# Patient Record
Sex: Female | Born: 1965 | Race: Black or African American | Hispanic: No | Marital: Married | Smoking: Current every day smoker
Health system: Southern US, Community
[De-identification: ages and names within clinical notes are randomized; demographics above are authoritative.]

## PROBLEM LIST (undated history)

## (undated) DIAGNOSIS — I10 Essential (primary) hypertension: Secondary | ICD-10-CM

## (undated) DIAGNOSIS — E119 Type 2 diabetes mellitus without complications: Secondary | ICD-10-CM

## (undated) DIAGNOSIS — Z87898 Personal history of other specified conditions: Secondary | ICD-10-CM

## (undated) DIAGNOSIS — J45909 Unspecified asthma, uncomplicated: Secondary | ICD-10-CM

## (undated) DIAGNOSIS — E785 Hyperlipidemia, unspecified: Secondary | ICD-10-CM

## (undated) DIAGNOSIS — Z8619 Personal history of other infectious and parasitic diseases: Secondary | ICD-10-CM

## (undated) HISTORY — DX: Personal history of other specified conditions: Z87.898

## (undated) HISTORY — PX: TUBAL LIGATION: SHX77

## (undated) HISTORY — DX: Hyperlipidemia, unspecified: E78.5

## (undated) HISTORY — DX: Essential (primary) hypertension: I10

## (undated) HISTORY — DX: Personal history of other infectious and parasitic diseases: Z86.19

## (undated) HISTORY — PX: CHOLECYSTECTOMY: SHX55

## (undated) HISTORY — DX: Type 2 diabetes mellitus without complications: E11.9

---

## 2010-07-23 ENCOUNTER — Emergency Department (HOSPITAL_COMMUNITY)
Admission: EM | Admit: 2010-07-23 | Discharge: 2010-07-23 | Payer: Self-pay | Source: Home / Self Care | Admitting: Emergency Medicine

## 2010-09-19 ENCOUNTER — Emergency Department (HOSPITAL_COMMUNITY)
Admission: EM | Admit: 2010-09-19 | Discharge: 2010-09-19 | Payer: Self-pay | Source: Home / Self Care | Admitting: Emergency Medicine

## 2010-09-27 LAB — RAPID STREP SCREEN (MED CTR MEBANE ONLY): Streptococcus, Group A Screen (Direct): NEGATIVE

## 2010-11-23 LAB — POCT PREGNANCY, URINE: Preg Test, Ur: NEGATIVE

## 2011-08-31 ENCOUNTER — Ambulatory Visit: Payer: Self-pay | Admitting: Internal Medicine

## 2015-10-23 ENCOUNTER — Other Ambulatory Visit: Payer: Self-pay | Admitting: Family Medicine

## 2015-10-27 LAB — CYTOLOGY - PAP

## 2017-02-10 ENCOUNTER — Encounter: Payer: Self-pay | Admitting: Physician Assistant

## 2017-02-10 ENCOUNTER — Ambulatory Visit: Payer: Self-pay | Admitting: Physician Assistant

## 2017-02-10 VITALS — BP 122/84 | HR 68 | Temp 98.6°F

## 2017-02-10 DIAGNOSIS — R05 Cough: Secondary | ICD-10-CM

## 2017-02-10 DIAGNOSIS — R059 Cough, unspecified: Secondary | ICD-10-CM

## 2017-02-10 MED ORDER — ALBUTEROL SULFATE HFA 108 (90 BASE) MCG/ACT IN AERS: 2.0000 | INHALATION_SPRAY | Freq: Four times a day (QID) | RESPIRATORY_TRACT | 0 refills | Status: DC | PRN

## 2017-02-10 NOTE — Progress Notes (Signed)
S: C/o cough congestion for 2 days, no fever, chills, cp/sob, v/d; mucus was yellow this am , states her chest felt a little tight, recently moved back to [place] and use to have asthma when she lived here before, doesn't have an inhaler now  Using otc meds: robitussin  O: PE: vitals wnl, nad, perrl eomi, normocephalic, tms dull, nasal mucosa red and swollen, throat injected, neck supple no lymph, lungs c t a, cv rrr, neuro intact  A:  Cough, ?asthma   P: drink fluids, continue regular meds , use otc meds of choice, return if not improving in 5 days, return earlier if worsening , albuterol inhaler, if not better Mon or if worsening will call in an antibiotic

## 2017-10-30 ENCOUNTER — Encounter (HOSPITAL_COMMUNITY): Payer: Self-pay | Admitting: Emergency Medicine

## 2017-10-30 ENCOUNTER — Ambulatory Visit (HOSPITAL_COMMUNITY)
Admission: EM | Admit: 2017-10-30 | Discharge: 2017-10-30 | Disposition: A | Discharge status: Home / Self Care | Payer: 59 | Attending: Family Medicine | Admitting: Family Medicine

## 2017-10-30 DIAGNOSIS — J069 Acute upper respiratory infection, unspecified: Secondary | ICD-10-CM | POA: Diagnosis not present

## 2017-10-30 DIAGNOSIS — B9789 Other viral agents as the cause of diseases classified elsewhere: Secondary | ICD-10-CM | POA: Diagnosis not present

## 2017-10-30 HISTORY — DX: Unspecified asthma, uncomplicated: J45.909

## 2017-10-30 MED ORDER — HYDROCODONE-HOMATROPINE 5-1.5 MG/5ML PO SYRP
5.0000 mL | ORAL_SOLUTION | Freq: Four times a day (QID) | ORAL | 0 refills | Status: DC | PRN
Start: 2017-10-30 — End: 2017-11-29

## 2017-10-30 NOTE — ED Triage Notes (Signed)
PT reports cough, chills, and dry scratchy throat.

## 2017-10-30 NOTE — ED Provider Notes (Signed)
  [facility]   [id number] 10/30/17 Arrival Time: 1704   SUBJECTIVE:  Laura Santana is a 52 y.o. female who presents to the urgent care with complaint of cough, chills, and dry scratchy throat.   Symptoms started overnight.  No fever  She works in the lab at [employer].  Has care of grandchild. Past Medical History:  Diagnosis Date  . Asthma    No family history on file. Social History   Socioeconomic History  . Marital status: Married    Spouse name: Not on file  . Number of children: Not on file  . Years of education: Not on file  . Highest education level: Not on file  Social Needs  . Financial resource strain: Not on file  . Food insecurity - worry: Not on file  . Food insecurity - inability: Not on file  . Transportation needs - medical: Not on file  . Transportation needs - non-medical: Not on file  Occupational History  . Not on file  Tobacco Use  . Smoking status: Current Every Day Smoker  . Smokeless tobacco: Never Used  Substance and Sexual Activity  . Alcohol use: Not on file  . Drug use: Not on file  . Sexual activity: Not on file  Other Topics Concern  . Not on file  Social History Narrative  . Not on file   No outpatient medications have been marked as taking for the 10/30/17 encounter [facility](Hospital Encounter).   Allergies  Allergen Reactions  . Penicillins       ROS: As per HPI, remainder of ROS negative.   OBJECTIVE:   Vitals:   10/30/17 1810 10/30/17 1811  BP:  (!) 151/85  Pulse:  94  Resp:  16  Temp:  98.8 F (37.1 C)  TempSrc:  Oral  SpO2:  98%  Weight: 173 lb (78.5 kg)   Height: 5\' 2"  (1.575 m)      General appearance: alert; no distress Eyes: PERRL; EOMI; conjunctiva normal HENT: normocephalic; atraumatic; TMs normal, canal normal, external ears normal without trauma; nasal mucosa normal; oral mucosa normal Neck: supple Lungs: clear to auscultation bilaterally Heart: regular rate and rhythm Back: no CVA  tenderness Extremities: no cyanosis or edema; symmetrical with no gross deformities Skin: warm and dry Neurologic: normal gait; grossly normal Psychological: alert and cooperative; normal mood and affect      Labs:  Results for orders placed or performed in visit on 10/23/15  Cytology - PAP  Result Value Ref Range   CYTOLOGY - PAP PAP RESULT     Labs Reviewed - No data to display  No results found.     ASSESSMENT & PLAN:  1. Viral URI with cough     Meds ordered this encounter  Medications  . HYDROcodone-homatropine (HYDROMET) 5-1.5 MG/5ML syrup    Sig: Take 5 mLs by mouth every 6 (six) hours as needed for cough.    Dispense:  60 mL    Refill:  0    Reviewed expectations re: course of current medical issues. Questions answered. Outlined signs and symptoms indicating need for more acute intervention. Patient verbalized understanding. After Visit Summary given.    Procedures:      Elvina SidleLauenstein, Demetrus Pavao, MD 10/30/17 [id number]

## 2017-11-02 DIAGNOSIS — J45901 Unspecified asthma with (acute) exacerbation: Secondary | ICD-10-CM | POA: Diagnosis not present

## 2017-11-02 DIAGNOSIS — R05 Cough: Secondary | ICD-10-CM | POA: Diagnosis present

## 2017-11-02 DIAGNOSIS — F17200 Nicotine dependence, unspecified, uncomplicated: Secondary | ICD-10-CM | POA: Insufficient documentation

## 2017-11-02 DIAGNOSIS — J069 Acute upper respiratory infection, unspecified: Secondary | ICD-10-CM | POA: Insufficient documentation

## 2017-11-02 DIAGNOSIS — J209 Acute bronchitis, unspecified: Secondary | ICD-10-CM | POA: Diagnosis not present

## 2017-11-03 ENCOUNTER — Emergency Department (HOSPITAL_COMMUNITY)
Admission: EM | Admit: 2017-11-03 | Discharge: 2017-11-03 | Disposition: A | Discharge status: Home / Self Care | Payer: 59 | Attending: Emergency Medicine | Admitting: Emergency Medicine

## 2017-11-03 ENCOUNTER — Emergency Department (HOSPITAL_COMMUNITY): Payer: 59

## 2017-11-03 ENCOUNTER — Encounter: Payer: Self-pay | Admitting: Emergency Medicine

## 2017-11-03 DIAGNOSIS — J209 Acute bronchitis, unspecified: Secondary | ICD-10-CM

## 2017-11-03 DIAGNOSIS — F17200 Nicotine dependence, unspecified, uncomplicated: Secondary | ICD-10-CM | POA: Diagnosis not present

## 2017-11-03 DIAGNOSIS — R05 Cough: Secondary | ICD-10-CM | POA: Diagnosis not present

## 2017-11-03 DIAGNOSIS — J069 Acute upper respiratory infection, unspecified: Secondary | ICD-10-CM

## 2017-11-03 DIAGNOSIS — J45901 Unspecified asthma with (acute) exacerbation: Secondary | ICD-10-CM | POA: Diagnosis not present

## 2017-11-03 MED ORDER — METHYLPREDNISOLONE 4 MG PO TBPK: ORAL_TABLET | ORAL | 0 refills | Status: DC

## 2017-11-03 MED ORDER — ALBUTEROL SULFATE (2.5 MG/3ML) 0.083% IN NEBU
5.0000 mg | INHALATION_SOLUTION | Freq: Once | RESPIRATORY_TRACT | Status: AC
  Administered 2017-11-03: 5 mg via RESPIRATORY_TRACT
  Filled 2017-11-03: qty 6

## 2017-11-03 MED ORDER — ALBUTEROL SULFATE HFA 108 (90 BASE) MCG/ACT IN AERS: 2.0000 | INHALATION_SPRAY | RESPIRATORY_TRACT | 0 refills | Status: DC | PRN

## 2017-11-03 NOTE — Discharge Instructions (Signed)
Get help right away if: °You cough up blood. °You have chest pain. °You have severe shortness of breath. °You become dehydrated. °You faint or keep feeling like you are going to faint. °You keep vomiting. °You have a severe headache. °Your fever or chills gets worse. °

## 2017-11-03 NOTE — ED Provider Notes (Signed)
[facility]EMERGENCY [facility]PT Provider Note   CSN: [id number] Arrival date & time: 11/02/17  2327     History   Chief Complaint Chief Complaint  Patient presents with  . URI    HPI Tayte Selena LesserY Kesinger is a 52 y.o. female who presents to the emergency department with chief complaint of cough.  She was seen by her primary care physician and given codeine cough syrup.  Patient states she has a history of asthma and feels like this is similar.  She has paroxysms of severe coughing and has difficulty breathing at times with tightness and wheezing.  She denies fever or chills. HPI  Past Medical History:  Diagnosis Date  . Asthma     There are no active problems to display for this patient.   Past Surgical History:  Procedure Laterality Date  . CHOLECYSTECTOMY    . TUBAL LIGATION      OB History    No data available       Home Medications    Prior to Admission medications   Medication Sig Start Date End Date Taking? Authorizing Provider  HYDROcodone-homatropine (HYDROMET) 5-1.5 MG/5ML syrup Take 5 mLs by mouth every 6 (six) hours as needed for cough. 10/30/17  Yes Elvina SidleLauenstein, Kurt, MD  ibuprofen (ADVIL,MOTRIN) 200 MG tablet Take 400 mg by mouth every 6 (six) hours as needed for moderate pain.   Yes [provider]  albuterol (PROVENTIL HFA;VENTOLIN HFA) 108 (90 Base) MCG/ACT inhaler Inhale 2 puffs into the lungs every 6 (six) hours as needed for wheezing or shortness of breath. Patient not taking: Reported on 11/03/2017 02/10/17   Faythe GheeFisher, Susan W, PA-C    Family History History reviewed. No pertinent family history.  Social History Social History   Tobacco Use  . Smoking status: Current Every Day Smoker  . Smokeless tobacco: Never Used  Substance Use Topics  . Alcohol use: No    Frequency: Never  . Drug use: No     Allergies   Penicillins   Review of Systems Review of Systems Ten systems reviewed and are negative for acute change,  except as noted in the HPI.    Physical Exam Updated Vital Signs BP (!) 154/86   Pulse 99   Temp 98.3 F (36.8 C) (Oral)   Resp 20   LMP 10/13/2017   SpO2 100%   Physical Exam  Constitutional: She is oriented to person, place, and time. She appears well-developed and well-nourished. No distress.  HENT:  Head: Normocephalic and atraumatic.  Eyes: Conjunctivae are normal. No scleral icterus.  Neck: Normal range of motion.  Cardiovascular: Normal rate, regular rhythm and normal heart sounds. Exam reveals no gallop and no friction rub.  No murmur heard. Pulmonary/Chest: Effort normal and breath sounds normal. No respiratory distress. She has no wheezes.  Abdominal: Soft. Bowel sounds are normal. She exhibits no distension and no mass. There is no tenderness. There is no guarding.  Neurological: She is alert and oriented to person, place, and time.  Skin: Skin is warm and dry. She is not diaphoretic.  Psychiatric: Her behavior is normal.  Nursing note and vitals reviewed.    ED Treatments / Results  Labs (all labs ordered are listed, but only abnormal results are displayed) Labs Reviewed - No data to display  EKG  EKG Interpretation None       Radiology Dg Chest 2 View  Result Date: 11/03/2017 CLINICAL DATA:  52 year old female with cough. EXAM: CHEST  2 VIEW  COMPARISON:  Chest radiograph dated 07/23/2010 FINDINGS: The heart size and mediastinal contours are within normal limits. Both lungs are clear. The visualized skeletal structures are unremarkable. IMPRESSION: No active cardiopulmonary disease. Electronically Signed   By: Elgie Collard M.D.   On: 11/03/2017 01:59    Procedures Procedures (including critical care time)  Medications Ordered in ED Medications  albuterol (PROVENTIL) (2.5 MG/3ML) 0.083% nebulizer solution 5 mg (5 mg Nebulization Given 11/03/17 0113)     Initial Impression / Assessment and Plan / ED Course  I have reviewed the triage vital signs  and the nursing notes.  Pertinent labs & imaging results that were available during my care of the patient were reviewed by me and considered in my medical decision making (see chart for details).     Patient with bronchitis and bronchospasm symptoms.  Patient will be given inhaler, work note and Medrol Dosepak.  I have advised her to follow-up with her primary care physician.  She appears appropriate for discharge at this time.  Final Clinical Impressions(s) / ED Diagnoses   Final diagnoses:  Upper respiratory tract infection, unspecified type  Bronchospasm with bronchitis, acute    ED Discharge Orders    None       Arthor Captain, PA-C 11/03/17 0426    Paula Libra, MD 11/03/17 [phone]

## 2017-11-03 NOTE — ED Triage Notes (Signed)
Pt complains of an URI for two days She has a hx of asthma but hasn't had an attack in years Pt was seen at Urgent Care and received cough medicine Pt states that she's had a productive cough and she's short of breath

## 2017-11-03 NOTE — ED Notes (Signed)
Bed: [facility] Expected date:  Expected time:  Means of arrival:  Comments: 

## 2017-11-29 ENCOUNTER — Other Ambulatory Visit: Payer: Self-pay

## 2017-11-29 ENCOUNTER — Ambulatory Visit (INDEPENDENT_AMBULATORY_CARE_PROVIDER_SITE_OTHER): Payer: 59 | Admitting: Physician Assistant

## 2017-11-29 ENCOUNTER — Encounter: Payer: Self-pay | Admitting: Physician Assistant

## 2017-11-29 VITALS — BP 120/88 | HR 93 | Temp 98.6°F | Resp 16 | Ht 62.0 in | Wt 174.0 lb

## 2017-11-29 DIAGNOSIS — R030 Elevated blood-pressure reading, without diagnosis of hypertension: Secondary | ICD-10-CM

## 2017-11-29 DIAGNOSIS — Z0000 Encounter for general adult medical examination without abnormal findings: Secondary | ICD-10-CM | POA: Diagnosis not present

## 2017-11-29 DIAGNOSIS — R7303 Prediabetes: Secondary | ICD-10-CM

## 2017-11-29 DIAGNOSIS — E119 Type 2 diabetes mellitus without complications: Secondary | ICD-10-CM | POA: Insufficient documentation

## 2017-11-29 DIAGNOSIS — Z0001 Encounter for general adult medical examination with abnormal findings: Secondary | ICD-10-CM | POA: Diagnosis not present

## 2017-11-29 DIAGNOSIS — B353 Tinea pedis: Secondary | ICD-10-CM | POA: Diagnosis not present

## 2017-11-29 DIAGNOSIS — G44229 Chronic tension-type headache, not intractable: Secondary | ICD-10-CM | POA: Diagnosis not present

## 2017-11-29 HISTORY — DX: Elevated blood-pressure reading, without diagnosis of hypertension: R03.0

## 2017-11-29 HISTORY — DX: Tinea pedis: B35.3

## 2017-11-29 HISTORY — DX: Chronic tension-type headache, not intractable: G44.229

## 2017-11-29 LAB — VITAMIN D 25 HYDROXY (VIT D DEFICIENCY, FRACTURES): VITD: 7.45 ng/mL — ABNORMAL LOW (ref 30.00–100.00)

## 2017-11-29 LAB — CBC WITH DIFFERENTIAL/PLATELET
Basophils Absolute: 0.1 10*3/uL (ref 0.0–0.1)
Basophils Relative: 0.8 % (ref 0.0–3.0)
Eosinophils Absolute: 0.5 10*3/uL (ref 0.0–0.7)
Eosinophils Relative: 5.4 % — ABNORMAL HIGH (ref 0.0–5.0)
HCT: 40.9 % (ref 36.0–46.0)
Hemoglobin: 13.4 g/dL (ref 12.0–15.0)
Lymphocytes Relative: 25.6 % (ref 12.0–46.0)
Lymphs Abs: 2.2 10*3/uL (ref 0.7–4.0)
MCHC: 32.9 g/dL (ref 30.0–36.0)
MCV: 90.6 fl (ref 78.0–100.0)
Monocytes Absolute: 0.8 10*3/uL (ref 0.1–1.0)
Monocytes Relative: 10.1 % (ref 3.0–12.0)
Neutro Abs: 4.9 10*3/uL (ref 1.4–7.7)
Neutrophils Relative %: 58.1 % (ref 43.0–77.0)
Platelets: 300 10*3/uL (ref 150.0–400.0)
RBC: 4.51 Mil/uL (ref 3.87–5.11)
RDW: 14.7 % (ref 11.5–15.5)
WBC: 8.4 10*3/uL (ref 4.0–10.5)

## 2017-11-29 LAB — URINALYSIS, ROUTINE W REFLEX MICROSCOPIC
Bilirubin Urine: NEGATIVE
Hgb urine dipstick: NEGATIVE
Ketones, ur: NEGATIVE
Leukocytes, UA: NEGATIVE
Nitrite: NEGATIVE
Specific Gravity, Urine: 1.025 (ref 1.000–1.030)
Total Protein, Urine: NEGATIVE
Urine Glucose: NEGATIVE
Urobilinogen, UA: 0.2 (ref 0.0–1.0)
pH: 6.5 (ref 5.0–8.0)

## 2017-11-29 LAB — HEMOGLOBIN A1C: Hgb A1c MFr Bld: 6.5 % (ref 4.6–6.5)

## 2017-11-29 LAB — TSH: TSH: 1.16 u[IU]/mL (ref 0.35–4.50)

## 2017-11-29 MED ORDER — TIZANIDINE HCL 2 MG PO CAPS: 2.0000 mg | ORAL_CAPSULE | Freq: Three times a day (TID) | ORAL | 0 refills | Status: DC

## 2017-11-29 MED ORDER — TIZANIDINE HCL 2 MG PO CAPS: 2.0000 mg | ORAL_CAPSULE | Freq: Three times a day (TID) | ORAL | 0 refills | Status: DC | PRN

## 2017-11-29 NOTE — Patient Instructions (Signed)
Please go to the lab for blood work.   Our office will call you with your results unless you have chosen to receive results via MyChart.  If your blood work is normal we will follow-up each year for physicals and as scheduled for chronic medical problems.  If anything is abnormal we will treat accordingly and get you in for a follow-up.    Keep your phone on. You will be contacted to schedule an appointment for a screening mammogram.   Please use the tizanidine as directed if needed for tension headache. I recommend seeing a good massage therapist.  Get a BP cuff for home use. Check BP a few times per week and record. Also check when you have a headache.  Follow-up in 2 weeks.    For yeast rash of feet. Keep clean and dry. Alternate footwear. Dry out socks completely.  Get some OTC Lamisil or Lotrimin to apply twice daily for up to 2 weeks.  We will reassess at your follow-up.    Preventive Care 40-64 Years, Female Preventive care refers to lifestyle choices and visits with your health care provider that can promote health and wellness. What does preventive care include?  A yearly physical exam. This is also called an annual well check.  Dental exams once or twice a year.  Routine eye exams. Ask your health care provider how often you should have your eyes checked.  Personal lifestyle choices, including: ? Daily care of your teeth and gums. ? Regular physical activity. ? Eating a healthy diet. ? Avoiding tobacco and drug use. ? Limiting alcohol use. ? Practicing safe sex. ? Taking low-dose aspirin daily starting at age 50. ? Taking vitamin and mineral supplements as recommended by your health care provider. What happens during an annual well check? The services and screenings done by your health care provider during your annual well check will depend on your age, overall health, lifestyle risk factors, and family history of disease. Counseling Your health care provider  may ask you questions about your:  Alcohol use.  Tobacco use.  Drug use.  Emotional well-being.  Home and relationship well-being.  Sexual activity.  Eating habits.  Work and work environment.  Method of birth control.  Menstrual cycle.  Pregnancy history.  Screening You may have the following tests or measurements:  Height, weight, and BMI.  Blood pressure.  Lipid and cholesterol levels. These may be checked every 5 years, or more frequently if you are over 50 years old.  Skin check.  Lung cancer screening. You may have this screening every year starting at age 55 if you have a 30-pack-year history of smoking and currently smoke or have quit within the past 15 years.  Fecal occult blood test (FOBT) of the stool. You may have this test every year starting at age 50.  Flexible sigmoidoscopy or colonoscopy. You may have a sigmoidoscopy every 5 years or a colonoscopy every 10 years starting at age 50.  Hepatitis C blood test.  Hepatitis B blood test.  Sexually transmitted disease (STD) testing.  Diabetes screening. This is done by checking your blood sugar (glucose) after you have not eaten for a while (fasting). You may have this done every 1-3 years.  Mammogram. This may be done every 1-2 years. Talk to your health care provider about when you should start having regular mammograms. This may depend on whether you have a family history of breast cancer.  BRCA-related cancer screening. This may be done if you   have a family history of breast, ovarian, tubal, or peritoneal cancers.  Pelvic exam and Pap test. This may be done every 3 years starting at age 36. Starting at age 68, this may be done every 5 years if you have a Pap test in combination with an HPV test.  Bone density scan. This is done to screen for osteoporosis. You may have this scan if you are at high risk for osteoporosis.  Discuss your test results, treatment options, and if necessary, the need for  more tests with your health care provider. Vaccines Your health care provider may recommend certain vaccines, such as:  Influenza vaccine. This is recommended every year.  Tetanus, diphtheria, and acellular pertussis (Tdap, Td) vaccine. You may need a Td booster every 10 years.  Varicella vaccine. You may need this if you have not been vaccinated.  Zoster vaccine. You may need this after age 63.  Measles, mumps, and rubella (MMR) vaccine. You may need at least one dose of MMR if you were born in 1957 or later. You may also need a second dose.  Pneumococcal 13-valent conjugate (PCV13) vaccine. You may need this if you have certain conditions and were not previously vaccinated.  Pneumococcal polysaccharide (PPSV23) vaccine. You may need one or two doses if you smoke cigarettes or if you have certain conditions.  Meningococcal vaccine. You may need this if you have certain conditions.  Hepatitis A vaccine. You may need this if you have certain conditions or if you travel or work in places where you may be exposed to hepatitis A.  Hepatitis B vaccine. You may need this if you have certain conditions or if you travel or work in places where you may be exposed to hepatitis B.  Haemophilus influenzae type b (Hib) vaccine. You may need this if you have certain conditions.  Talk to your health care provider about which screenings and vaccines you need and how often you need them. This information is not intended to replace advice given to you by your health care provider. Make sure you discuss any questions you have with your health care provider. Document Released: 09/25/2015 Document Revised: 05/18/2016 Document Reviewed: 06/30/2015 Elsevier Interactive Patient Education  [employer].

## 2017-11-29 NOTE — Assessment & Plan Note (Signed)
Hygiene reviewed. Start Lotrimin to help with this. We will check A1C today giving history of prediabetes.

## 2017-11-29 NOTE — Assessment & Plan Note (Signed)
BP good today. Elevations have been during illness. Discussed low-sodium diet. Will have her get a home BP cuff to monitor BP at home. Call for any consistent elevations. Will monitor at subsequent visits.

## 2017-11-29 NOTE — Assessment & Plan Note (Signed)
Depression screen negative. Health Maintenance reviewed. Endorses Tetanus up-to-date. Will bring in records.  Preventive schedule discussed and handout given in AVS. Will obtain fasting labs today.

## 2017-11-29 NOTE — Assessment & Plan Note (Signed)
Labs today. Discussed massage therapy and stretching. Will start Tizanidine to help with acute headache. Close home BP monitoring recommended to see if this is contributing to headaches as well. If BP look good and symptoms are not improving, will need to consider imaging.

## 2017-11-29 NOTE — Progress Notes (Signed)
Patient presents to clinic today to establish care.  Acute Concerns: Patient endorses history of frequent headaches, increasing in frequency over the past month. Endorses headaches are throbbing and associated with nausea. Occurring several times per week. Notes associated neck tension when she has the headaches. Denies trauma or injury to the neck. Denies radiation of pain into upper extremities. Patient denies chest pain, palpitations, lightheadedness, dizziness, vision changes or frequent headaches. Notes occasional swelling in her ankles at the end of the work day. Is working on a low-salt diet. Has joined [employer] to help with BMI. Has recently restarted walking daily. Endorses history of pre-diabetes.   Endorses itchy, scaling rash of feet bilaterally x 2 months.  Endorses nocturia, Endorses sweet smell of urine on occasion.   Health Maintenance: Immunizations -- Flu and Tetanus up-to-date.  [place] 432015 [place] Denies history of abnormal mammogram. Due. Agrees to screening.  PAP -- Last 2017. Normal per patient.   Past Medical History:  Diagnosis Date  . Asthma   . History of chickenpox   . History of prediabetes     Past Surgical History:  Procedure Laterality Date  . CHOLECYSTECTOMY    . TUBAL LIGATION      No current outpatient medications on file prior to visit.   No current facility-administered medications on file prior to visit.     Allergies  Allergen Reactions  . Penicillins Rash    Family History  Problem Relation Age of Onset  . Asthma Mother   . Stroke Mother   . Kidney disease Father   . Alcohol abuse Father   . Hypertension Sister   . Diabetes Brother     Social History   Socioeconomic History  . Marital status: Married    Spouse name: Not on file  . Number of children: Not on file  . Years of education: Not on file  . Highest education level: Not on file  Social Needs  . Financial resource strain: Not  on file  . Food insecurity - worry: Not on file  . Food insecurity - inability: Not on file  . Transportation needs - medical: Not on file  . Transportation needs - non-medical: Not on file  Occupational History  . Occupation: [employer]  Tobacco Use  . Smoking status: Current Every Day Smoker    Packs/day: 0.25    Types: Cigarettes  . Smokeless tobacco: Never Used  Substance and Sexual Activity  . Alcohol use: No    Frequency: Never  . Drug use: No  . Sexual activity: Yes    Birth control/protection: Surgical  Other Topics Concern  . Not on file  Social History Narrative  . Not on file   Review of Systems  Constitutional: Positive for malaise/fatigue. Negative for chills, fever and weight loss.  HENT: Negative for hearing loss and tinnitus.   Eyes: Negative for blurred vision and double vision.  Cardiovascular: Negative for chest pain and palpitations.  Skin: Positive for rash.  Neurological: Negative for dizziness, tingling, tremors and headaches.  Psychiatric/Behavioral: Negative for depression, hallucinations, substance abuse and suicidal ideas. The patient is not nervous/anxious and does not have insomnia.    BP 120/88   Pulse 93   Temp 98.6 F (37 C) (Oral)   Resp 16   Ht 5\' 2"  (1.575 m)   Wt 174 lb (78.9 kg)   SpO2 98%   BMI 31.83 kg/m   Physical Exam  Constitutional: She is oriented to person,  place, and time.  Cardiovascular: Normal rate, regular rhythm, normal heart sounds and intact distal pulses.  Pulmonary/Chest: Effort normal and breath sounds normal. No respiratory distress. She has no wheezes. She has no rales. She exhibits no tenderness.  Abdominal: Soft.  Neurological: She is alert and oriented to person, place, and time.  Skin: Skin is warm and dry.  Psychiatric: Affect normal.  Vitals reviewed.  Assessment/Plan: Chronic tension-type headache, not intractable Labs today. Discussed massage therapy and stretching. Will start Tizanidine  to help with acute headache. Close home BP monitoring recommended to see if this is contributing to headaches as well. If BP look good and symptoms are not improving, will need to consider imaging.   Tinea pedis of both feet Hygiene reviewed. Start Lotrimin to help with this. We will check A1C today giving history of prediabetes.   Visit for preventive health examination Depression screen negative. Health Maintenance reviewed. Endorses Tetanus up-to-date. Will bring in records.  Preventive schedule discussed and handout given in AVS. Will obtain fasting labs today.   Borderline diabetes + history. Discussed dietary and exercise recommendations. Will repeat BMP, A1C and UA today.   Elevated BP without diagnosis of hypertension BP good today. Elevations have been during illness. Discussed low-sodium diet. Will have her get a home BP cuff to monitor BP at home. Call for any consistent elevations. Will monitor at subsequent visits.     Piedad Climes, PA-C

## 2017-11-29 NOTE — Assessment & Plan Note (Signed)
+   history. Discussed dietary and exercise recommendations. Will repeat BMP, A1C and UA today.

## 2017-11-30 LAB — COMPREHENSIVE METABOLIC PANEL
ALT: 11 U/L (ref 0–35)
AST: 12 U/L (ref 0–37)
Albumin: 4.3 g/dL (ref 3.5–5.2)
Alkaline Phosphatase: 67 U/L (ref 39–117)
BUN: 10 mg/dL (ref 6–23)
CO2: 25 mEq/L (ref 19–32)
Calcium: 9.6 mg/dL (ref 8.4–10.5)
Chloride: 104 mEq/L (ref 96–112)
Creatinine, Ser: 0.69 mg/dL (ref 0.40–1.20)
GFR: 116.09 mL/min (ref >60.00)
Glucose, Bld: 114 mg/dL — ABNORMAL HIGH (ref 70–99)
Potassium: 4.5 mEq/L (ref 3.5–5.1)
Sodium: 139 mEq/L (ref 135–145)
Total Bilirubin: 0.2 mg/dL (ref 0.2–1.2)
Total Protein: 7 g/dL (ref 6.0–8.3)

## 2017-11-30 LAB — LIPID PANEL
Cholesterol: 201 mg/dL — ABNORMAL HIGH (ref 0–200)
HDL: 44.8 mg/dL (ref >39.00)
LDL Cholesterol: 141 mg/dL — ABNORMAL HIGH (ref 0–99)
NonHDL: 156.35
Total CHOL/HDL Ratio: 4
Triglycerides: 77 mg/dL (ref 0.0–149.0)
VLDL: 15.4 mg/dL (ref 0.0–40.0)

## 2017-12-04 ENCOUNTER — Other Ambulatory Visit: Payer: Self-pay

## 2017-12-04 ENCOUNTER — Other Ambulatory Visit: Payer: Self-pay | Admitting: Physician Assistant

## 2017-12-04 DIAGNOSIS — E119 Type 2 diabetes mellitus without complications: Secondary | ICD-10-CM

## 2017-12-04 MED ORDER — VITAMIN D (ERGOCALCIFEROL) 1.25 MG (50000 UNIT) PO CAPS: 50000.0000 [IU] | ORAL_CAPSULE | ORAL | 0 refills | Status: DC

## 2017-12-04 MED ORDER — ATORVASTATIN CALCIUM 10 MG PO TABS: 10.0000 mg | ORAL_TABLET | Freq: Every day | ORAL | 1 refills | Status: DC

## 2017-12-04 MED ORDER — METFORMIN HCL 500 MG PO TABS: 500.0000 mg | ORAL_TABLET | Freq: Every day | ORAL | 1 refills | Status: DC

## 2017-12-18 ENCOUNTER — Ambulatory Visit: Payer: Self-pay | Admitting: Physician Assistant

## 2018-01-15 ENCOUNTER — Ambulatory Visit (INDEPENDENT_AMBULATORY_CARE_PROVIDER_SITE_OTHER): Payer: PRIVATE HEALTH INSURANCE | Admitting: Physician Assistant

## 2018-01-15 ENCOUNTER — Other Ambulatory Visit: Payer: Self-pay

## 2018-01-15 ENCOUNTER — Encounter: Payer: Self-pay | Admitting: Physician Assistant

## 2018-01-15 VITALS — BP 110/82 | HR 92 | Temp 98.4°F | Resp 14 | Ht 62.0 in | Wt 160.0 lb

## 2018-01-15 DIAGNOSIS — B353 Tinea pedis: Secondary | ICD-10-CM | POA: Diagnosis not present

## 2018-01-15 DIAGNOSIS — E119 Type 2 diabetes mellitus without complications: Secondary | ICD-10-CM | POA: Diagnosis not present

## 2018-01-15 DIAGNOSIS — B9789 Other viral agents as the cause of diseases classified elsewhere: Secondary | ICD-10-CM

## 2018-01-15 DIAGNOSIS — J069 Acute upper respiratory infection, unspecified: Secondary | ICD-10-CM | POA: Diagnosis not present

## 2018-01-15 LAB — BASIC METABOLIC PANEL
BUN: 7 mg/dL (ref 6–23)
CO2: 30 mEq/L (ref 19–32)
Calcium: 9.2 mg/dL (ref 8.4–10.5)
Chloride: 102 mEq/L (ref 96–112)
Creatinine, Ser: 0.72 mg/dL (ref 0.40–1.20)
GFR: 110.47 mL/min (ref >60.00)
Glucose, Bld: 99 mg/dL (ref 70–99)
Potassium: 4.2 mEq/L (ref 3.5–5.1)
Sodium: 139 mEq/L (ref 135–145)

## 2018-01-15 LAB — HM PAP SMEAR: HM Pap smear: NEGATIVE

## 2018-01-15 LAB — HM DIABETES FOOT EXAM: HM Diabetic Foot Exam: NEGATIVE

## 2018-01-15 MED ORDER — ALBUTEROL SULFATE HFA 108 (90 BASE) MCG/ACT IN AERS: 2.0000 | INHALATION_SPRAY | Freq: Four times a day (QID) | RESPIRATORY_TRACT | 0 refills | Status: DC | PRN

## 2018-01-15 MED ORDER — FLUCONAZOLE 150 MG PO TABS: 150.0000 mg | ORAL_TABLET | Freq: Once | ORAL | 0 refills | Status: AC

## 2018-01-15 NOTE — Assessment & Plan Note (Signed)
Much improved. Still some residual areas. Will add on Diflucan x 1. Call if not resolving.

## 2018-01-15 NOTE — Assessment & Plan Note (Signed)
2 days of symptoms. Mild wheeze on examination. Supportive measures and OTC medications reviewed. Rx albuterol. Start Mucinex-DM and saline nasal rinses. Follow-up if not improving.

## 2018-01-15 NOTE — Progress Notes (Signed)
History of Present Illness: Patient is a 52 y.o. female who presents to clinic today for follow-up of Diabetes Mellitus II, controlled -new onset.  Patient currently on medication regimen of Metformin 500 mg dail.  Is taking medications as directed. Endorses working very hard on diet and exercise. Has not seen Diabetic Nutritionist. Has joined [facility], having follow-up twice weekly. > 10,000 steps per day.  Denies vision changes, numbness/tinging of hands or feet. Is due for foot and eye examination.   Latest Maintenance: A1C --  Lab Results  Component Value Date   HGBA1C 6.5 11/29/2017   Diabetic Eye Exam -- Due. Last eye exam 2 years ago. Notes mild changes in vision over the past 2 years.  Urine Microalbumin -- Due. Will obtain today. Foot Exam -- Due. Recent diagnosis of tinea pedis.Is using the lamisil as directed with marked improvement. Is still having some areas of rash despite medication.   Endorses 2 days of nasal congestion with pnd, cough and chest tightness. Endorses cough is productive of thin yellow sputum. Denies recent travel. Endorses multiple sick contacts in the family. Notes some chills last night but denies objective fevers. Is taking Alka Seltzer cold.    Past Medical History:  Diagnosis Date  . Asthma   . History of chickenpox   . History of prediabetes     Current Outpatient Medications on File Prior to Visit  Medication Sig Dispense Refill  . atorvastatin (LIPITOR) 10 MG tablet Take 1 tablet (10 mg total) by mouth daily. 30 tablet 1  . metFORMIN (GLUCOPHAGE) 500 MG tablet Take 1 tablet (500 mg total) by mouth daily. 30 tablet 1  . Vitamin D, Ergocalciferol, (DRISDOL) 50000 units CAPS capsule Take 1 capsule (50,000 Units total) by mouth every 7 (seven) days. 12 capsule 0  . tizanidine (ZANAFLEX) 2 MG capsule Take 1 capsule (2 mg total) by mouth 3 (three) times daily as needed for muscle spasms. (Patient not taking: Reported on 01/15/2018) 30  capsule 0   No current facility-administered medications on file prior to visit.     Allergies  Allergen Reactions  . Penicillins Rash    Family History  Problem Relation Age of Onset  . Asthma Mother   . Stroke Mother   . Kidney disease Father   . Alcohol abuse Father   . Hypertension Sister   . Diabetes Brother     Social History   Socioeconomic History  . Marital status: Married    Spouse name: Not on file  . Number of children: Not on file  . Years of education: Not on file  . Highest education level: Not on file  Occupational History  . Occupation: [employer]  . Financial resource strain: Not on file  . Food insecurity:    Worry: Not on file    Inability: Not on file  . Transportation needs:    Medical: Not on file    Non-medical: Not on file  Tobacco Use  . Smoking status: Current Every Day Smoker    Packs/day: 0.25    Types: Cigarettes  . Smokeless tobacco: Never Used  Substance and Sexual Activity  . Alcohol use: No    Frequency: Never  . Drug use: No  . Sexual activity: Yes    Birth control/protection: Surgical  Lifestyle  . Physical activity:    Days per week: Not on file    Minutes per session: Not on file  . Stress: Not on file  Relationships  . Social connections:    Talks on phone: Not on file    Gets together: Not on file    Attends religious service: Not on file    Active member of club or organization: Not on file    Attends meetings of clubs or organizations: Not on file    Relationship status: Not on file  Other Topics Concern  . Not on file  Social History Narrative  . Not on file    Review of Systems: Pertinent ROS are listed in HPI  Physical Examination: BP 110/82   Pulse 92   Temp 98.4 F (36.9 C) (Oral)   Resp 14   Ht  (1.575 m)   Wt 160 lb (72.6 kg)   SpO2 98%   BMI 29.26 kg/m  General appearance: alert, cooperative, appears stated age and no distress Head: Normocephalic, without  obvious abnormality, atraumatic Ears: normal TM's and external ear canals both ears Nose: clear discharge Throat: lips, mucosa, and tongue normal; teeth and gums normal Lungs: wheezes base - bilateral -- mild Heart: regular rate and rhythm, S1, S2 normal, no murmur, click, rub or gallop Extremities: extremities normal, atraumatic, no cyanosis or edema Pulses: 2+ and symmetric Skin: Skin color, texture, turgor normal. No rashes or lesions Neurologic: Alert and oriented X 3, normal strength and tone. Normal symmetric reflexes. Normal coordination and gait  Diabetic Foot Exam - Simple   Simple Foot Form Diabetic Foot exam was performed with the following findings:  Yes 01/15/2018  9:00 AM  Visual Inspection No deformities, no ulcerations, no other skin breakdown bilaterally:  Yes Sensation Testing Intact to touch and monofilament testing bilaterally:  Yes Pulse Check Posterior Tibialis and Dorsalis pulse intact bilaterally:  Yes Comments    Assessment/Plan: Tinea pedis of both feet Much improved. Still some residual areas. Will add on Diflucan x 1. Call if not resolving.   Viral URI with cough 2 days of symptoms. Mild wheeze on examination. Supportive measures and OTC medications reviewed. Rx albuterol. Start Mucinex-DM and saline nasal rinses. Follow-up if not improving.   New onset type 2 diabetes mellitus (HCC) 14 pound weight loss with diet and exercise. Is taking metformin as directed. Repeat BMP today. Keep up great work with diet and exercise. Referral to Ophthalmology placed. Diabetic foot exam updated -- mild tinea pedis. Otherwise normal. Follow-up 8 weeks for labs -- repeat A1C. Will check BMP today.

## 2018-01-15 NOTE — Assessment & Plan Note (Signed)
14 pound weight loss with diet and exercise. Is taking metformin as directed. Repeat BMP today. Keep up great work with diet and exercise. Referral to Ophthalmology placed. Diabetic foot exam updated -- mild tinea pedis. Otherwise normal. Follow-up 8 weeks for labs -- repeat A1C. Will check BMP today.

## 2018-01-15 NOTE — Patient Instructions (Signed)
Please go to the lab today for blood work.  I will call you with your results. We will alter treatment regimen(s) if indicated by your results.   Please take the Diflucan as directed. Do not take your cholesterol medication on this day or the day after. Then resume.  You will be contacted for a diabetic eye examination.  Please stay well-hydrated and get plenty of rest.  Start mucinex-DM to thin congestion and help with cough. Start a saline nasal rinse to flush out nasal passages (Do this 1-2 x day).  Run your humidifier. I have sent in an inhaler for you to use as directed.   Follow-up in 8 weeks for lab only appointment to recheck your A1C.    How to Use a Metered Dose Inhaler A metered dose inhaler is a handheld device for taking medicine that must be breathed into the lungs (inhaled). The device can be used to deliver a variety of inhaled medicines, including:  Quick relief or rescue medicines, such as bronchodilators.  Controller medicines, such as corticosteroids.  The medicine is delivered by pushing down on a metal canister to release a preset amount of spray and medicine. Each device contains the amount of medicine that is needed for a preset number of uses (inhalations). Your health care provider may recommend that you use a spacer with your inhaler to help you take the medicine more effectively. A spacer is a plastic tube with a mouthpiece on one end and an opening that connects to the inhaler on the other end. A spacer holds the medicine in a tube for a short time, which allows you to inhale more medicine. What are the risks? If you do not use your inhaler correctly, medicine might not reach your lungs to help you breathe. Inhaler medicine can cause side effects, such as:  Mouth or throat infection.  Cough.  Hoarseness.  Headache.  Nausea and vomiting.  Lung infection (pneumonia) in people who have a lung condition called COPD.  How to use a metered dose  inhaler without a spacer 1. Remove the cap from the inhaler. 2. If you are using the inhaler for the first time, shake it for 5 seconds, turn it away from your face, then release 4 puffs into the air. This is called priming. 3. Shake the inhaler for 5 seconds. 4. Position the inhaler so the top of the canister faces up. 5. Put your index finger on the top of the medicine canister. Support the bottom of the inhaler with your thumb. 6. Breathe out normally and as completely as possible, away from the inhaler. 7. Either place the inhaler between your teeth and close your lips tightly around the mouthpiece, or hold the inhaler 1-2 inches (2.5-5 cm) away from your open mouth. Keep your tongue down out of the way. If you are unsure which technique to use, ask your health care provider. 8. Press the canister down with your index finger to release the medicine, then inhale deeply and slowly through your mouth (not your nose) until your lungs are completely filled. Inhaling should take 4-6 seconds. 9. Hold the medicine in your lungs for 5-10 seconds (10 seconds is best). This helps the medicine get into the small airways of your lungs. 10. With your lips in a tight circle (pursed), breathe out slowly. 11. Repeat steps 3-10 until you have taken the number of puffs that your health care provider directed. Wait about 1 minute between puffs or as directed. 12. Put the  cap on the inhaler. 13. If you are using a steroid inhaler, rinse your mouth with water, gargle, and spit out the water. Do not swallow the water. How to use a metered dose inhaler with a spacer 1. Remove the cap from the inhaler. 2. If you are using the inhaler for the first time, shake it for 5 seconds, turn it away from your face, then release 4 puffs into the air. This is called priming. 3. Shake the inhaler for 5 seconds. 4. Place the open end of the spacer onto the inhaler mouthpiece. 5. Position the inhaler so the top of the canister faces  up and the spacer mouthpiece faces you. 6. Put your index finger on the top of the medicine canister. Support the bottom of the inhaler and the spacer with your thumb. 7. Breathe out normally and as completely as possible, away from the spacer. 8. Place the spacer between your teeth and close your lips tightly around it. Keep your tongue down out of the way. 9. Press the canister down with your index finger to release the medicine, then inhale deeply and slowly through your mouth (not your nose) until your lungs are completely filled. Inhaling should take 4-6 seconds. 10. Hold the medicine in your lungs for 5-10 seconds (10 seconds is best). This helps the medicine get into the small airways of your lungs. 11. With your lips in a tight circle (pursed), breathe out slowly. 12. Repeat steps 3-11 until you have taken the number of puffs that your health care provider directed. Wait about 1 minute between puffs or as directed. 13. Remove the spacer from the inhaler and put the cap on the inhaler. 14. If you are using a steroid inhaler, rinse your mouth with water, gargle, and spit out the water. Do not swallow the water. Follow these instructions at home:  Take your inhaled medicine only as told by your health care provider. Do not use the inhaler more than directed by your health care provider.  Keep all follow-up visits as told by your health care provider. This is important.  If your inhaler has a counter, you can check it to determine how full your inhaler is. If your inhaler does not have a counter, ask your health care provider when you will need to refill your inhaler and write the refill date on a calendar or on your inhaler canister. Note that you cannot know when an inhaler is empty by shaking it.  Follow directions on the package insert for care and cleaning of your inhaler and spacer. Contact a health care provider if:  Symptoms are only partially relieved with your inhaler.  You are  having trouble using your inhaler.  You have an increase in phlegm.  You have headaches. Get help right away if:  You feel little or no relief after using your inhaler.  You have dizziness.  You have a fast heart rate.  You have chills or a fever.  You have night sweats.  There is blood in your phlegm. Summary  A metered dose inhaler is a handheld device for taking medicine that must be breathed into the lungs (inhaled).  The medicine is delivered by pushing down on a metal canister to release a preset amount of spray and medicine.  Each device contains the amount of medicine that is needed for a preset number of uses (inhalations). This information is not intended to replace advice given to you by your health care provider. Make sure you  discuss any questions you have with your health care provider. Document Released: 08/29/2005 Document Revised: 07/19/2016 Document Reviewed: 07/19/2016 Elsevier Interactive Patient Education  2017 Elsevier Inc.   Diabetes and Foot Care Diabetes may cause you to have problems because of poor blood supply (circulation) to your feet and legs. This may cause the skin on your feet to become thinner, break easier, and heal more slowly. Your skin may become dry, and the skin may peel and crack. You may also have nerve damage in your legs and feet causing decreased feeling in them. You may not notice minor injuries to your feet that could lead to infections or more serious problems. Taking care of your feet is one of the most important things you can do for yourself. Follow these instructions at home:  Wear shoes at all times, even in the house. Do not go barefoot. Bare feet are easily injured.  Check your feet daily for blisters, cuts, and redness. If you cannot see the bottom of your feet, use a mirror or ask someone for help.  Wash your feet with warm water (do not use hot water) and mild soap. Then pat your feet and the areas between your toes  until they are completely dry. Do not soak your feet as this can dry your skin.  Apply a moisturizing lotion or petroleum jelly (that does not contain alcohol and is unscented) to the skin on your feet and to dry, brittle toenails. Do not apply lotion between your toes.  Trim your toenails straight across. Do not dig under them or around the cuticle. File the edges of your nails with an emery board or nail file.  Do not cut corns or calluses or try to remove them with medicine.  Wear clean socks or stockings every day. Make sure they are not too tight. Do not wear knee-high stockings since they may decrease blood flow to your legs.  Wear shoes that fit properly and have enough cushioning. To break in new shoes, wear them for just a few hours a day. This prevents you from injuring your feet. Always look in your shoes before you put them on to be sure there are no objects inside.  Do not cross your legs. This may decrease the blood flow to your feet.  If you find a minor scrape, cut, or break in the skin on your feet, keep it and the skin around it clean and dry. These areas may be cleansed with mild soap and water. Do not cleanse the area with peroxide, alcohol, or iodine.  When you remove an adhesive bandage, be sure not to damage the skin around it.  If you have a wound, look at it several times a day to make sure it is healing.  Do not use heating pads or hot water bottles. They may burn your skin. If you have lost feeling in your feet or legs, you may not know it is happening until it is too late.  Make sure your health care provider performs a complete foot exam at least annually or more often if you have foot problems. Report any cuts, sores, or bruises to your health care provider immediately. Contact a health care provider if:  You have an injury that is not healing.  You have cuts or breaks in the skin.  You have an ingrown nail.  You notice redness on your legs or feet.  You  feel burning or tingling in your legs or feet.  You have  pain or cramps in your legs and feet.  Your legs or feet are numb.  Your feet always feel cold. Get help right away if:  There is increasing redness, swelling, or pain in or around a wound.  There is a red line that goes up your leg.  Pus is coming from a wound.  You develop a fever or as directed by your health care provider.  You notice a bad smell coming from an ulcer or wound. This information is not intended to replace advice given to you by your health care provider. Make sure you discuss any questions you have with your health care provider. Document Released: 08/26/2000 Document Revised: 02/04/2016 Document Reviewed: 02/05/2013 Elsevier Interactive Patient Education  2017 [employer].

## 2018-01-16 ENCOUNTER — Encounter: Payer: Self-pay | Admitting: Emergency Medicine

## 2018-01-27 ENCOUNTER — Other Ambulatory Visit: Payer: Self-pay | Admitting: Physician Assistant

## 2018-03-02 ENCOUNTER — Other Ambulatory Visit: Payer: Self-pay | Admitting: Physician Assistant

## 2018-03-12 ENCOUNTER — Other Ambulatory Visit: Payer: PRIVATE HEALTH INSURANCE

## 2018-03-19 ENCOUNTER — Other Ambulatory Visit (INDEPENDENT_AMBULATORY_CARE_PROVIDER_SITE_OTHER): Payer: PRIVATE HEALTH INSURANCE

## 2018-03-19 DIAGNOSIS — E119 Type 2 diabetes mellitus without complications: Secondary | ICD-10-CM | POA: Diagnosis not present

## 2018-03-20 LAB — HEMOGLOBIN A1C: Hgb A1c MFr Bld: 6.4 % (ref 4.6–6.5)

## 2018-05-01 ENCOUNTER — Ambulatory Visit (INDEPENDENT_AMBULATORY_CARE_PROVIDER_SITE_OTHER): Payer: PRIVATE HEALTH INSURANCE | Admitting: Physician Assistant

## 2018-05-01 ENCOUNTER — Encounter: Payer: Self-pay | Admitting: Physician Assistant

## 2018-05-01 ENCOUNTER — Other Ambulatory Visit: Payer: Self-pay

## 2018-05-01 VITALS — BP 110/78 | HR 77 | Temp 98.2°F | Resp 14 | Ht 62.0 in | Wt 161.0 lb

## 2018-05-01 DIAGNOSIS — G44209 Tension-type headache, unspecified, not intractable: Secondary | ICD-10-CM

## 2018-05-01 MED ORDER — KETOROLAC TROMETHAMINE 60 MG/2ML IM SOLN
60.0000 mg | Freq: Once | INTRAMUSCULAR | Status: AC
  Administered 2018-05-01: 60 mg via INTRAMUSCULAR

## 2018-05-01 MED ORDER — BACLOFEN 10 MG PO TABS: 10.0000 mg | ORAL_TABLET | Freq: Two times a day (BID) | ORAL | 0 refills | Status: DC

## 2018-05-01 NOTE — Progress Notes (Signed)
Patient with history of tension and migraine headache presents to clinic today c/o 1 days of headache described as vice like grip associated with neck tension and pain in L shoulder. Notes pain has been slowly worsening since waking this morning. Denies AMS, vision changes, photophobia, phonophobia. Denies vomiting, fever, chills.   Past Medical History:  Diagnosis Date  . Asthma   . History of chickenpox   . History of prediabetes     Current Outpatient Medications on File Prior to Visit  Medication Sig Dispense Refill  . albuterol (PROVENTIL HFA;VENTOLIN HFA) 108 (90 Base) MCG/ACT inhaler Inhale 2 puffs into the lungs every 6 (six) hours as needed for wheezing or shortness of breath. 1 Inhaler 0  . atorvastatin (LIPITOR) 10 MG tablet TAKE 1 TABLET BY MOUTH EVERY DAY 90 tablet 1  . metFORMIN (GLUCOPHAGE) 500 MG tablet TAKE 1 TABLET BY MOUTH EVERY DAY 90 tablet 1   No current facility-administered medications on file prior to visit.     Allergies  Allergen Reactions  . Penicillins Rash    Family History  Problem Relation Age of Onset  . Asthma Mother   . Stroke Mother   . Kidney disease Father   . Alcohol abuse Father   . Hypertension Sister   . Diabetes Brother     Social History   Socioeconomic History  . Marital status: Married    Spouse name: Not on file  . Number of children: Not on file  . Years of education: Not on file  . Highest education level: Not on file  Occupational History  . Occupation: [employer]  Social Needs  . Financial resource strain: Not on file  . Food insecurity:    Worry: Not on file    Inability: Not on file  . Transportation needs:    Medical: Not on file    Non-medical: Not on file  Tobacco Use  . Smoking status: Current Every Day Smoker    Packs/day: 0.25    Types: Cigarettes  . Smokeless tobacco: Never Used  Substance and Sexual Activity  . Alcohol use: No    Frequency: Never  . Drug use: No  . Sexual activity: Yes     Birth control/protection: Surgical  Lifestyle  . Physical activity:    Days per week: Not on file    Minutes per session: Not on file  . Stress: Not on file  Relationships  . Social connections:    Talks on phone: Not on file    Gets together: Not on file    Attends religious service: Not on file    Active member of club or organization: Not on file    Attends meetings of clubs or organizations: Not on file    Relationship status: Not on file  Other Topics Concern  . Not on file  Social History Narrative  . Not on file   Review of Systems - See HPI.  All other ROS are negative.  BP 110/78   Pulse 77   Temp 98.2 F (36.8 C) (Oral)   Resp 14   Ht 5\' 2"  (1.575 m)   Wt 161 lb (73 kg)   SpO2 99%   BMI 29.45 kg/m   Physical Exam  Constitutional: She appears well-developed and well-nourished.  HENT:  Head: Normocephalic and atraumatic.  Mouth/Throat: Oropharynx is clear and moist.  Eyes: Pupils are equal, round, and reactive to light. EOM are normal.  Neck: Neck supple. Muscular tenderness present. No spinous process  tenderness present. No neck rigidity. No edema and normal range of motion present.  Cardiovascular: Normal rate, regular rhythm and normal heart sounds.  Pulmonary/Chest: Effort normal and breath sounds normal. No respiratory distress.  Vitals reviewed.  Recent Results (from the past 2160 hour(s))  HgB A1c     Status: None   Collection Time: 03/19/18  4:04 PM  Result Value Ref Range   Hgb A1c MFr Bld 6.4 4.6 - 6.5 %    Comment: Glycemic Control Guidelines for People with Diabetes:Non Diabetic:  <6%Goal of Therapy: <7%Additional Action Suggested:  >8%     Assessment/Plan: 1. Tension headache Im Toradol given today for abortive therapy. She is to stop Excedrin. Tylenol for breakthrough pain. Start Baclofen as directed. Supportive measures reviewed. Follow-up if not resolving. - ketorolac (TORADOL) injection 60 mg - baclofen (LIORESAL) 10 MG tablet; Take  1 tablet (10 mg total) by mouth 2 (two) times daily.  Dispense: 10 each; Refill: 0   Piedad ClimesWilliam Cody Daviona Herbert, PA-C

## 2018-05-01 NOTE — Patient Instructions (Signed)
Please stay well-hydrated and get plenty of rest.  The Toradol shot will help to resolve the current headache.  Make sure to avoid any more Excedrin use.  Tylenol for breakthrough pain.  Use the Baclofen as directed to alleviated muscular tension. Do not drive while taking this medication. A massage will also be beneficial.    Tension Headache A tension headache is pain, pressure, or aching that is felt over the front and sides of your head. These headaches can last from 30 minutes to several days. Follow these instructions at home: Managing pain  Take over-the-counter and prescription medicines only as told by your doctor.  Lie down in a dark, quiet room when you have a headache.  If directed, apply ice to your head and neck area: ? Put ice in a plastic bag. ? Place a towel between your skin and the bag. ? Leave the ice on for 20 minutes, 2-3 times per day.  Use a heating pad or a hot shower to apply heat to your head and neck area as told by your doctor. Eating and drinking  Eat meals on a regular schedule.  Do not drink a lot of alcohol.  Do not use a lot of caffeine, or stop using caffeine. General instructions  Keep all follow-up visits as told by your doctor. This is important.  Keep a journal to find out if certain things bring on headaches. For example, write down: ? What you eat and drink. ? How much sleep you get. ? Any change to your diet or medicines.  Try getting a massage, or doing other things that help you to relax.  Lessen stress.  Sit up straight. Do not tighten (tense) your muscles.  Do not use tobacco products. This includes cigarettes, chewing tobacco, or e-cigarettes. If you need help quitting, ask your doctor.  Exercise regularly as told by your doctor.  Get enough sleep. This may mean 7-9 hours of sleep. Contact a doctor if:  Your symptoms are not helped by medicine.  You have a headache that feels different from your usual  headache.  You feel sick to your stomach (nauseous) or you throw up (vomit).  You have a fever. Get help right away if:  Your headache becomes very bad.  You keep throwing up.  You have a stiff neck.  You have trouble seeing.  You have trouble speaking.  You have pain in your eye or ear.  Your muscles are weak or you lose muscle control.  You lose your balance or you have trouble walking.  You feel like you will pass out (faint) or you pass out.  You have confusion. This information is not intended to replace advice given to you by your health care provider. Make sure you discuss any questions you have with your health care provider. Document Released: 11/23/2009 Document Revised: 04/28/2016 Document Reviewed: 12/22/2014 Elsevier Interactive Patient Education  [employer]2018 Else[employer] Inc.

## 2018-07-31 ENCOUNTER — Other Ambulatory Visit: Payer: Self-pay | Admitting: Physician Assistant

## 2018-08-22 ENCOUNTER — Ambulatory Visit: Payer: Self-pay

## 2018-08-22 ENCOUNTER — Ambulatory Visit: Payer: Self-pay | Admitting: Physician Assistant

## 2018-08-22 ENCOUNTER — Other Ambulatory Visit: Payer: Self-pay | Admitting: Physician Assistant

## 2018-08-22 ENCOUNTER — Encounter: Payer: Self-pay | Admitting: Family Medicine

## 2018-08-22 ENCOUNTER — Ambulatory Visit (INDEPENDENT_AMBULATORY_CARE_PROVIDER_SITE_OTHER): Payer: PRIVATE HEALTH INSURANCE | Admitting: Family Medicine

## 2018-08-22 VITALS — BP 130/88 | HR 83 | Temp 98.6°F | Ht 62.0 in | Wt 164.4 lb

## 2018-08-22 DIAGNOSIS — E1169 Type 2 diabetes mellitus with other specified complication: Secondary | ICD-10-CM | POA: Diagnosis not present

## 2018-08-22 DIAGNOSIS — R69 Illness, unspecified: Secondary | ICD-10-CM | POA: Diagnosis not present

## 2018-08-22 DIAGNOSIS — J4521 Mild intermittent asthma with (acute) exacerbation: Secondary | ICD-10-CM

## 2018-08-22 DIAGNOSIS — J111 Influenza due to unidentified influenza virus with other respiratory manifestations: Secondary | ICD-10-CM

## 2018-08-22 DIAGNOSIS — F17210 Nicotine dependence, cigarettes, uncomplicated: Secondary | ICD-10-CM

## 2018-08-22 MED ORDER — ALBUTEROL SULFATE HFA 108 (90 BASE) MCG/ACT IN AERS: 2.0000 | INHALATION_SPRAY | Freq: Four times a day (QID) | RESPIRATORY_TRACT | 0 refills | Status: DC | PRN

## 2018-08-22 MED ORDER — PREDNISONE 5 MG PO TABS: ORAL_TABLET | ORAL | 0 refills | Status: DC

## 2018-08-22 MED ORDER — DOXYCYCLINE HYCLATE 100 MG PO TABS: 100.0000 mg | ORAL_TABLET | Freq: Two times a day (BID) | ORAL | 0 refills | Status: DC

## 2018-08-22 MED ORDER — FLUTICASONE PROPIONATE HFA 220 MCG/ACT IN AERO: 1.0000 | INHALATION_SPRAY | Freq: Two times a day (BID) | RESPIRATORY_TRACT | 12 refills | Status: DC

## 2018-08-22 MED ORDER — HYDROCODONE-HOMATROPINE 5-1.5 MG/5ML PO SYRP: 5.0000 mL | ORAL_SOLUTION | Freq: Three times a day (TID) | ORAL | 0 refills | Status: DC | PRN

## 2018-08-22 NOTE — Telephone Encounter (Signed)
FYI, Appt with CM at 1:15PM

## 2018-08-22 NOTE — Progress Notes (Signed)
Laura Santana is a 52 y.o. female here for an acute visit.  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  Cough  This is a new problem. The current episode started in the past 7 days. The problem has been gradually worsening. The problem occurs constantly. The cough is productive of sputum (yellow). Associated symptoms include shortness of breath and sweats. Pertinent negatives include no ear congestion, ear pain, fever or wheezing. Nothing aggravates the symptoms. She has tried OTC cough suppressant for the symptoms. The treatment provided no relief. Her past medical history is significant for asthma and pneumonia.   PMHx, SurgHx, SocialHx, Medications, and Allergies were reviewed in the Visit Navigator and updated as appropriate.  Current Medications:   .  atorvastatin (LIPITOR) 10 MG tablet, Take 1 tablet (10 mg total) by mouth daily., Disp: 30 tablet, Rfl: 0 .  baclofen (LIORESAL) 10 MG tablet, Take 1 tablet (10 mg total) by mouth 2 (two) times daily., Disp: 10 each, Rfl: 0 .  metFORMIN (GLUCOPHAGE) 500 MG tablet, TAKE 1 TABLET BY MOUTH EVERY DAY, Disp: 30 tablet, Rfl: 0  Allergies  Allergen Reactions  . Penicillins Rash   Review of Systems:   Pertinent items are noted in the HPI. Otherwise, ROS is negative.  Vitals:   Vitals:   08/22/18 1124  BP: 130/88  Pulse: 83  Temp: 98.6 F (37 C)  TempSrc: Oral  SpO2: 97%  Weight: 164 lb 6.4 oz (74.6 kg)  Height: 5\' 2"  (1.575 m)     Body mass index is 30.07 kg/m.  Physical Exam:   Physical Exam  Constitutional: She is oriented to person, place, and time. She appears well-developed and well-nourished. No distress.  HENT:  Head: Normocephalic and atraumatic.  Right Ear: External ear normal.  Left Ear: External ear normal.  Nose: Nose normal.  Mouth/Throat: Oropharynx is clear and moist.  Eyes: Pupils are equal, round, and reactive to light. Conjunctivae and EOM are normal.  Neck:  Normal range of motion. Neck supple. No thyromegaly present.  Cardiovascular: Normal rate, regular rhythm and intact distal pulses.  Pulmonary/Chest: Effort normal. No accessory muscle usage. No tachypnea. No respiratory distress. She has decreased breath sounds. She has no rhonchi. She has no rales.  Abdominal: Soft. Bowel sounds are normal.  Musculoskeletal: Normal range of motion.  Lymphadenopathy:    She has no cervical adenopathy.  Neurological: She is alert and oriented to person, place, and time.  Skin: Skin is warm and dry. Capillary refill takes less than 2 seconds.  Psychiatric: She has a normal mood and affect. Her behavior is normal.  Nursing note and vitals reviewed.  Assessment and Plan:   Laura Santana was seen today for cough.  Diagnoses and all orders for this visit:  Mild intermittent asthma with exacerbation Comments: New. Discussed symptomatic care and red flags. Orders: -     HYDROcodone-homatropine (HYCODAN) 5-1.5 MG/5ML syrup; Take 5 mLs by mouth every 8 (eight) hours as needed for cough. -     doxycycline (VIBRA-TABS) 100 MG tablet; Take 1 tablet (100 mg total) by mouth 2 (two) times daily. -     predniSONE (DELTASONE) 5 MG tablet; 6-5-4-3-2-1-off -     albuterol (PROVENTIL HFA;VENTOLIN HFA) 108 (90 Base) MCG/ACT inhaler; Inhale 2 puffs into the lungs every 6 (six) hours as needed for wheezing or shortness of breath. -     fluticasone (FLOVENT HFA) 220 MCG/ACT inhaler; Inhale 1 puff into the lungs 2 (  two) times daily.  Cigarette nicotine dependence without complication Comments:  History: Social History   Tobacco Use  . Smoking status: Current Every Day Smoker    Packs/day: 0.25    Types: Cigarettes  . Smokeless tobacco: Never Used  Substance Use Topics  . Alcohol use: No    Frequency: Never   Assessment/Plan: The patient was counseled on the dangers of tobacco use, and was advised to quit.  Reviewed strategies to maximize success, including removing  cigarettes and smoking materials from environment, stress management, support of family/friends, written materials, local smoking cessation programs (1-800-QUIT-NOW and SMOKEFREE.GOV) and pharmacotherapy. Greater than 3 minutes were spent on counseling today.  Influenza-like illness Comments: Last week, now resolved.   Type 2 diabetes mellitus with other specified complication, without long-term current use of insulin (HCC) Comments: A1c controlled, on Metformin.   . Reviewed expectations re: course of current medical issues. . Discussed self-management of symptoms. . Outlined signs and symptoms indicating need for more acute intervention. . Patient verbalized understanding and all questions were answered. Marland Kitchen Health Maintenance issues including appropriate healthy diet, exercise, and smoking avoidance were discussed with patient. . See orders for this visit as documented in the electronic medical record. . Patient received an After Visit Summary.  CMA served as Neurosurgeon during this visit. History, Physical, and Plan performed by medical provider. The above documentation has been reviewed and is accurate and complete. Helane Rima, D.O.  Helane Rima, DO [place] [place] [facility] 08/22/2018

## 2018-08-22 NOTE — Telephone Encounter (Signed)
C/o onset of scratchy throat, with progression of nasal congestion, cough, shortness of breath, and chest tightness on Thurs., 12/5.  Denied any wheezing.  Reported has taken Alka Seltzer Plus, and recently started on Mucinex.  Reported the cough has been dry, but coughed up thick, yellow mucus today.  C/o "late night sweats."  Stated shortness of breath is present with rest and activity; "it feels better to sit up."  Reported she ran out of her inhaler.  Appt. Sched. With PCP at 1:15 PM. Care advice given per protocol.  Verb. Understanding.  Agrees with plan.     Reason for Disposition . [1] Continuous (nonstop) coughing interferes with work or school AND [2] no improvement using cough treatment per protocol    Onset of cough 5-6 days ago with increased frequency; had been dry and now this morning coughed up thick yellow mucus; some shortness of breath with activity and rest.  Has difficulty laying flat.  Answer Assessment - Initial Assessment Questions 1. ONSET: "When did the cough begin?"      Thurs.  2. SEVERITY: "How bad is the cough today?"      Frequent  3. RESPIRATORY DISTRESS: "Describe your breathing."      Shortness of breath with activity and at rest  4. FEVER: "Do you have a fever?" If so, ask: "What is your temperature, how was it measured, and when did it start?"     Denied  5. HEMOPTYSIS: "Are you coughing up any blood?" If so ask: "How much?" (flecks, streaks, tablespoons, etc.)     denied 6. TREATMENT: "What have you done so far to treat the cough?" (e.g., meds, fluids, humidifier)      Alka Seltzer Plus, and Mucinex 7. CARDIAC HISTORY: "Do you have any history of heart disease?" (e.g., heart attack, congestive heart failure)     Denied  8. LUNG HISTORY: "Do you have any history of lung disease?"  (e.g., pulmonary embolus, asthma, emphysema)    Asthma 9. PE RISK FACTORS: "Do you have a history of blood clots?" (or: recent major surgery, recent prolonged travel, bedridden)  n/a 10. OTHER SYMPTOMS: "Do you have any other symptoms? (e.g., runny nose, wheezing, chest pain)       Tightness in chest , clear nasal drainage, pressure under eyes and sides of nose, sweating, SOB, coughed up thick yellow mucus x 1 this morning.   11. PREGNANCY: "Is there any chance you are pregnant?" "When was your last menstrual period?"       LMP on cycle at present 12. TRAVEL: "Have you traveled out of the country in the last month?" (e.g., travel history, exposures)       denied  Protocols used: COUGH - ACUTE NON-PRODUCTIVE-A-AH

## 2018-08-24 ENCOUNTER — Encounter: Payer: Self-pay | Admitting: Physician Assistant

## 2018-09-01 ENCOUNTER — Other Ambulatory Visit: Payer: Self-pay | Admitting: Physician Assistant

## 2018-09-18 ENCOUNTER — Other Ambulatory Visit: Payer: Self-pay | Admitting: Family Medicine

## 2018-09-18 DIAGNOSIS — J4521 Mild intermittent asthma with (acute) exacerbation: Secondary | ICD-10-CM

## 2018-09-26 ENCOUNTER — Encounter: Payer: Self-pay | Admitting: Physician Assistant

## 2018-09-26 ENCOUNTER — Other Ambulatory Visit: Payer: Self-pay

## 2018-09-26 ENCOUNTER — Ambulatory Visit (INDEPENDENT_AMBULATORY_CARE_PROVIDER_SITE_OTHER): Payer: PRIVATE HEALTH INSURANCE | Admitting: Physician Assistant

## 2018-09-26 VITALS — BP 122/86 | HR 91 | Temp 98.9°F | Resp 16 | Ht 62.0 in | Wt 164.0 lb

## 2018-09-26 DIAGNOSIS — B9689 Other specified bacterial agents as the cause of diseases classified elsewhere: Secondary | ICD-10-CM

## 2018-09-26 DIAGNOSIS — R062 Wheezing: Secondary | ICD-10-CM

## 2018-09-26 DIAGNOSIS — J208 Acute bronchitis due to other specified organisms: Secondary | ICD-10-CM

## 2018-09-26 MED ORDER — AZITHROMYCIN 250 MG PO TABS: ORAL_TABLET | ORAL | 0 refills | Status: DC

## 2018-09-26 MED ORDER — ALBUTEROL SULFATE HFA 108 (90 BASE) MCG/ACT IN AERS: INHALATION_SPRAY | RESPIRATORY_TRACT | 3 refills | Status: DC

## 2018-09-26 MED ORDER — FLUTICASONE PROPIONATE HFA 220 MCG/ACT IN AERO: 1.0000 | INHALATION_SPRAY | Freq: Two times a day (BID) | RESPIRATORY_TRACT | 12 refills | Status: DC

## 2018-09-26 MED ORDER — BENZONATATE 100 MG PO CAPS: 100.0000 mg | ORAL_CAPSULE | Freq: Two times a day (BID) | ORAL | 0 refills | Status: DC | PRN

## 2018-09-26 MED ORDER — IPRATROPIUM-ALBUTEROL 0.5-2.5 (3) MG/3ML IN SOLN
3.0000 mL | Freq: Once | RESPIRATORY_TRACT | Status: AC
  Administered 2018-09-26: 3 mL via RESPIRATORY_TRACT

## 2018-09-26 MED ORDER — PREDNISONE 20 MG PO TABS: 40.0000 mg | ORAL_TABLET | Freq: Every day | ORAL | 0 refills | Status: DC

## 2018-09-26 NOTE — Progress Notes (Signed)
Acute Office Visit  Subjective:    Patient ID: Laura Santana, female    DOB: 11-Sep-1966, 53 y.o.   MRN: [id number]  Chief Complaint  Patient presents with  . URI    Patient is in today for 2 weeks of chest congestion, nasal congestion, productive cough of thick yellow sputum and some sweating. Denies fever but notes some chills. Was initially seen, diagnosed and treated for bronchitis before christmas. Notes resolution of symptoms with antibiotic but shortly after symptoms recurred and she has been dealing with them for 2 weeks since then.  Past Medical History:  Diagnosis Date  . Asthma   . History of chickenpox   . History of prediabetes     Past Surgical History:  Procedure Laterality Date  . CHOLECYSTECTOMY    . TUBAL LIGATION      Family History  Problem Relation Age of Onset  . Asthma Mother   . Stroke Mother   . Kidney disease Father   . Alcohol abuse Father   . Hypertension Sister   . Diabetes Brother     Social History   Socioeconomic History  . Marital status: Married    Spouse name: Not on file  . Number of children: Not on file  . Years of education: Not on file  . Highest education level: Not on file  Occupational History  . Occupation: [employer]  . Financial resource strain: Not on file  . Food insecurity:    Worry: Not on file    Inability: Not on file  . Transportation needs:    Medical: Not on file    Non-medical: Not on file  Tobacco Use  . Smoking status: Current Every Day Smoker    Packs/day: 0.25    Types: Cigarettes  . Smokeless tobacco: Never Used  Substance and Sexual Activity  . Alcohol use: No    Frequency: Never  . Drug use: No  . Sexual activity: Yes    Birth control/protection: Surgical  Lifestyle  . Physical activity:    Days per week: Not on file    Minutes per session: Not on file  . Stress: Not on file  Relationships  . Social connections:    Talks on phone: Not on file    Gets  together: Not on file    Attends religious service: Not on file    Active member of club or organization: Not on file    Attends meetings of clubs or organizations: Not on file    Relationship status: Not on file  . Intimate partner violence:    Fear of current or ex partner: Not on file    Emotionally abused: Not on file    Physically abused: Not on file    Forced sexual activity: Not on file  Other Topics Concern  . Not on file  Social History Narrative  . Not on file    Outpatient Medications Prior to Visit  Medication Sig Dispense Refill  . albuterol (PROVENTIL HFA;VENTOLIN HFA) 108 (90 Base) MCG/ACT inhaler TAKE 2 PUFFS BY MOUTH EVERY 6 HOURS AS NEEDED FOR WHEEZE OR SHORTNESS OF BREATH 8.5 Inhaler 0  . atorvastatin (LIPITOR) 10 MG tablet Take 1 tablet (10 mg total) by mouth daily. 30 tablet 0  . fluticasone (FLOVENT HFA) 220 MCG/ACT inhaler Inhale 1 puff into the lungs 2 (two) times daily. 1 Inhaler 12  . metFORMIN (GLUCOPHAGE) 500 MG tablet TAKE 1 TABLET BY MOUTH EVERY DAY 30 tablet 0  .  baclofen (LIORESAL) 10 MG tablet Take 1 tablet (10 mg total) by mouth 2 (two) times daily. 10 each 0  . predniSONE (DELTASONE) 5 MG tablet 6-5-4-3-2-1-off 21 tablet 0  . doxycycline (VIBRA-TABS) 100 MG tablet Take 1 tablet (100 mg total) by mouth 2 (two) times daily. 20 tablet 0  . HYDROcodone-homatropine (HYCODAN) 5-1.5 MG/5ML syrup Take 5 mLs by mouth every 8 (eight) hours as needed for cough. 120 mL 0   No facility-administered medications prior to visit.     Allergies  Allergen Reactions  . Penicillins Rash    Review of Systems  Constitutional: Positive for diaphoresis and malaise/fatigue. Negative for chills and fever.  HENT: Positive for congestion. Negative for ear discharge, ear pain, sinus pain and sore throat.   Respiratory: Positive for cough, sputum production and wheezing.   Gastrointestinal: Negative for constipation, diarrhea and vomiting.  Musculoskeletal: Negative for  joint pain, myalgias and neck pain.  Neurological: Negative for dizziness, weakness and headaches.       Objective:    Physical Exam  Constitutional: She is oriented to person, place, and time. She appears well-developed and well-nourished.  HENT:  Head: Normocephalic.  Right Ear: Hearing normal. Tympanic membrane is erythematous.  Left Ear: Hearing normal.  Mouth/Throat: Uvula is midline.  Neck: Neck supple.  Cardiovascular: Regular rhythm and normal heart sounds. Tachycardia present.  Pulmonary/Chest: She has wheezes in the right upper field, the right middle field, the left upper field and the left middle field.  Inspiratory and expiratory wheezes heard in RUF,RMF, LUF,LMF  Neurological: She is alert and oriented to person, place, and time.    BP 122/86   Pulse 91   Temp 98.9 F (37.2 C) (Oral)   Resp 16   Ht 5\' 2"  (1.575 m)   Wt 74.4 kg   SpO2 98%   BMI 30.00 kg/m  Wt Readings from Last 3 Encounters:  09/26/18 74.4 kg  08/22/18 74.6 kg  05/01/18 73 kg    Health Maintenance Due  Topic Date Due  . PNEUMOCOCCAL POLYSACCHARIDE VACCINE AGE 64-64 HIGH RISK  06/26/1970  . OPHTHALMOLOGY EXAM  06/26/1978  . URINE MICROALBUMIN  06/26/1978  . DTaP/Tdap/Td (1 - Tdap) 06/27/1979  . TETANUS/TDAP  06/27/1987  . INFLUENZA VACCINE  04/12/2018  . MAMMOGRAM  06/26/2018  . COLONOSCOPY  06/26/2018  . HEMOGLOBIN A1C  09/19/2018  . PAP SMEAR-Modifier  10/22/2018    There are no preventive care reminders to display for this patient.   Lab Results  Component Value Date   TSH 1.16 11/29/2017   Lab Results  Component Value Date   WBC 8.4 11/29/2017   HGB 13.4 11/29/2017   HCT 40.9 11/29/2017   MCV 90.6 11/29/2017   PLT 300.0 11/29/2017   Lab Results  Component Value Date   NA 139 01/15/2018   K 4.2 01/15/2018   CO2 30 01/15/2018   GLUCOSE 99 01/15/2018   BUN 7 01/15/2018   CREATININE 0.72 01/15/2018   BILITOT 0.2 11/29/2017   ALKPHOS 67 11/29/2017   AST 12  11/29/2017   ALT 11 11/29/2017   PROT 7.0 11/29/2017   ALBUMIN 4.3 11/29/2017   CALCIUM 9.2 01/15/2018   GFR 110.47 01/15/2018   Lab Results  Component Value Date   CHOL 201 (H) 11/29/2017   Lab Results  Component Value Date   HDL 44.80 11/29/2017   Lab Results  Component Value Date   LDLCALC 141 (H) 11/29/2017   Lab Results  Component Value Date  TRIG 77.0 11/29/2017   Lab Results  Component Value Date   CHOLHDL 4 11/29/2017   Lab Results  Component Value Date   HGBA1C 6.4 03/19/2018       Assessment & Plan:   1. Acute bacterial bronchitis Recurrent with wheezing. Afebrile but noted fatigue. Lungs with insp/exp wheezing bilaterally but no findings concerning for pneumonia. Continue supportive measures and OTC medications. Begin Z-pack and Tessalon. Prednisone for wheezing as noted below.  - benzonatate (TESSALON) 100 MG capsule; Take 1 capsule (100 mg total) by mouth 2 (two) times daily as needed for cough.  Dispense: 20 capsule; Refill: 0 - azithromycin (ZITHROMAX) 250 MG tablet; Take 2 tablets on Day 1. Then take 1 tablet daily.  Dispense: 6 tablet; Refill: 0  2. Wheezing - ipratropium-albuterol (DUONEB) 0.5-2.5 (3) MG/3ML nebulizer solution 3 mL - predniSONE (DELTASONE) 20 MG tablet; Take 2 tablets (40 mg total) by mouth daily with breakfast.  Dispense: 10 tablet; Refill: 0 - fluticasone (FLOVENT HFA) 220 MCG/ACT inhaler; Inhale 1 puff into the lungs 2 (two) times daily.  Dispense: 1 Inhaler; Refill: 12 - albuterol (PROVENTIL HFA;VENTOLIN HFA) 108 (90 Base) MCG/ACT inhaler; TAKE 2 PUFFS BY MOUTH EVERY 6 HOURS AS NEEDED FOR WHEEZE OR SHORTNESS OF BREATH  Dispense: 18 g; Refill: 3   No orders of the defined types were placed in this encounter.    Erin E Mecum, Student-PA

## 2018-09-26 NOTE — Patient Instructions (Signed)
Take antibiotic (Azithromycin) as directed.  Increase fluids.  Get plenty of rest. Use Mucinex for congestion. Tessalon for cough. Take prednisone and use Flovent as directed. Take a daily probiotic (I recommend Align or Culturelle, but even Activia Yogurt may be beneficial).  A humidifier placed in the bedroom may offer some relief for a dry, scratchy throat of nasal irritation.  Read information below on acute bronchitis. Please call or return to clinic if symptoms are not improving.  Acute Bronchitis Bronchitis is when the airways that extend from the windpipe into the lungs get red, puffy, and painful (inflamed). Bronchitis often causes thick spit (mucus) to develop. This leads to a cough. A cough is the most common symptom of bronchitis. In acute bronchitis, the condition usually begins suddenly and goes away over time (usually in 2 weeks). Smoking, allergies, and asthma can make bronchitis worse. Repeated episodes of bronchitis may cause more lung problems.  HOME CARE  Rest.  Drink enough fluids to keep your pee (urine) clear or pale yellow (unless you need to limit fluids as told by your doctor).  Only take over-the-counter or prescription medicines as told by your doctor.  Avoid smoking and secondhand smoke. These can make bronchitis worse. If you are a smoker, think about using nicotine gum or skin patches. Quitting smoking will help your lungs heal faster.  Reduce the chance of getting bronchitis again by:  Washing your hands often.  Avoiding people with cold symptoms.  Trying not to touch your hands to your mouth, nose, or eyes.  Follow up with your doctor as told.  GET HELP IF: Your symptoms do not improve after 1 week of treatment. Symptoms include:  Cough.  Fever.  Coughing up thick spit.  Body aches.  Chest congestion.  Chills.  Shortness of breath.  Sore throat.  GET HELP RIGHT AWAY IF:   You have an increased fever.  You have chills.  You have  severe shortness of breath.  You have bloody thick spit (sputum).  You throw up (vomit) often.  You lose too much body fluid (dehydration).  You have a severe headache.  You faint.  MAKE SURE YOU:   Understand these instructions.  Will watch your condition.  Will get help right away if you are not doing well or get worse. Document Released: 02/15/2008 Document Revised: 05/01/2013 Document Reviewed: 02/19/2013 [facility] Patient Information 2015 [place], [place]. This information is not intended to replace advice given to you by your health care provider. Make sure you discuss any questions you have with your health care provider.

## 2018-10-08 ENCOUNTER — Encounter: Payer: Self-pay | Admitting: Physician Assistant

## 2018-10-08 ENCOUNTER — Ambulatory Visit (INDEPENDENT_AMBULATORY_CARE_PROVIDER_SITE_OTHER): Payer: PRIVATE HEALTH INSURANCE | Admitting: Physician Assistant

## 2018-10-08 ENCOUNTER — Other Ambulatory Visit: Payer: Self-pay

## 2018-10-08 VITALS — BP 122/80 | HR 94 | Temp 98.8°F | Resp 14 | Ht 62.0 in | Wt 165.0 lb

## 2018-10-08 DIAGNOSIS — Z720 Tobacco use: Secondary | ICD-10-CM | POA: Diagnosis not present

## 2018-10-08 DIAGNOSIS — Z1211 Encounter for screening for malignant neoplasm of colon: Secondary | ICD-10-CM

## 2018-10-08 DIAGNOSIS — Z1239 Encounter for other screening for malignant neoplasm of breast: Secondary | ICD-10-CM

## 2018-10-08 DIAGNOSIS — E119 Type 2 diabetes mellitus without complications: Secondary | ICD-10-CM | POA: Diagnosis not present

## 2018-10-08 DIAGNOSIS — Z23 Encounter for immunization: Secondary | ICD-10-CM | POA: Diagnosis not present

## 2018-10-08 LAB — LIPID PANEL
Cholesterol: 224 mg/dL — ABNORMAL HIGH (ref 0–200)
HDL: 54.4 mg/dL (ref >39.00)
LDL Cholesterol: 153 mg/dL — ABNORMAL HIGH (ref 0–99)
NonHDL: 170.08
Total CHOL/HDL Ratio: 4
Triglycerides: 87 mg/dL (ref 0.0–149.0)
VLDL: 17.4 mg/dL (ref 0.0–40.0)

## 2018-10-08 LAB — COMPREHENSIVE METABOLIC PANEL
ALK PHOS: 65 U/L (ref 39–117)
ALT: 14 U/L (ref 0–35)
AST: 12 U/L (ref 0–37)
Albumin: 4.2 g/dL (ref 3.5–5.2)
BUN: 10 mg/dL (ref 6–23)
CO2: 25 mEq/L (ref 19–32)
Calcium: 9.3 mg/dL (ref 8.4–10.5)
Chloride: 103 mEq/L (ref 96–112)
Creatinine, Ser: 0.63 mg/dL (ref 0.40–1.20)
GFR: 120.89 mL/min (ref >60.00)
Glucose, Bld: 83 mg/dL (ref 70–99)
Potassium: 4.3 mEq/L (ref 3.5–5.1)
Sodium: 137 mEq/L (ref 135–145)
Total Bilirubin: 0.5 mg/dL (ref 0.2–1.2)
Total Protein: 7 g/dL (ref 6.0–8.3)

## 2018-10-08 LAB — TSH: TSH: 0.78 u[IU]/mL (ref 0.35–4.50)

## 2018-10-08 LAB — HEMOGLOBIN A1C: Hgb A1c MFr Bld: 6.5 % (ref 4.6–6.5)

## 2018-10-08 MED ORDER — VARENICLINE TARTRATE 0.5 MG X 11 & 1 MG X 42 PO MISC: ORAL | 0 refills | Status: DC

## 2018-10-08 NOTE — Patient Instructions (Signed)
Please go to the lab today for blood work.  I will call you with your results. We will alter treatment regimen(s) if indicated by your results.   Keep up with diet and exercise.   Start the Chantix starter pack taking as directed. If you note any change in mood, nausea or stomach upset -- stop and call me.

## 2018-10-08 NOTE — Progress Notes (Signed)
History of Present Illness: Patient is a 53 y.o. female who presents to clinic today for follow-up of Diabetes Mellitus II, previously well-controlled without complication.  Patient currently on medication regimen of Metformin 500 mg QD.  Was taking medications as directed. Endorses noting significant fatigue with Metformin so she stopped about 1.5 weeks ago. Notes significant improvement in energy since stopping.  Denies polyuria, polydipsia or polyphagia. Denies vision changes. Is not checking blood glucose as directed.   Patient also with hyperlipidemia on a regimen of atorvastatin daily. States she stopped this several months ago and has been trying to manage with diet and exercise = joined a gym (enjoys walking on treadmill) and is walking 15 miles per week with work. Diet= fruit for breakfast with oatmeal (own spices and stevia), cooks lunches and usually skips dinner Lunch - salad or soup. Avoiding breads.   Would like to discuss smoking cessation today. Has tried patches (burned skin) and wellbutrin before. Has considered gum but would like to discuss other pharmacological interventions as well.  Latest Maintenance: A1C --  Lab Results  Component Value Date   HGBA1C 6.4 03/19/2018   Diabetic Eye Exam -- Due. Needs to schedule one but wants to make sure insurance covers it  Past Medical History:  Diagnosis Date  . Asthma   . History of chickenpox   . History of prediabetes     Current Outpatient Medications on File Prior to Visit  Medication Sig Dispense Refill  . albuterol (PROVENTIL HFA;VENTOLIN HFA) 108 (90 Base) MCG/ACT inhaler TAKE 2 PUFFS BY MOUTH EVERY 6 HOURS AS NEEDED FOR WHEEZE OR SHORTNESS OF BREATH 18 g 3  . atorvastatin (LIPITOR) 10 MG tablet Take 1 tablet (10 mg total) by mouth daily. 30 tablet 0  . fluticasone (FLOVENT HFA) 220 MCG/ACT inhaler Inhale 1 puff into the lungs 2 (two) times daily. 1 Inhaler 12  . metFORMIN (GLUCOPHAGE) 500 MG tablet TAKE 1 TABLET BY  MOUTH EVERY DAY 30 tablet 0   No current facility-administered medications on file prior to visit.     Allergies  Allergen Reactions  . Penicillins Rash    Family History  Problem Relation Age of Onset  . Asthma Mother   . Stroke Mother   . Kidney disease Father   . Alcohol abuse Father   . Hypertension Sister   . Diabetes Brother     Social History   Socioeconomic History  . Marital status: Married    Spouse name: Not on file  . Number of children: Not on file  . Years of education: Not on file  . Highest education level: Not on file  Occupational History  . Occupation: [employer]  . Financial resource strain: Not on file  . Food insecurity:    Worry: Not on file    Inability: Not on file  . Transportation needs:    Medical: Not on file    Non-medical: Not on file  Tobacco Use  . Smoking status: Current Every Day Smoker    Packs/day: 0.25    Types: Cigarettes  . Smokeless tobacco: Never Used  Substance and Sexual Activity  . Alcohol use: No    Frequency: Never  . Drug use: No  . Sexual activity: Yes    Birth control/protection: Surgical  Lifestyle  . Physical activity:    Days per week: Not on file    Minutes per session: Not on file  . Stress: Not on file  Relationships  .  Social connections:    Talks on phone: Not on file    Gets together: Not on file    Attends religious service: Not on file    Active member of club or organization: Not on file    Attends meetings of clubs or organizations: Not on file    Relationship status: Not on file  Other Topics Concern  . Not on file  Social History Narrative  . Not on file   Review of Systems: Pertinent ROS are listed in HPI  Physical Examination: BP 122/80   Pulse 94   Temp 98.8 F (37.1 C) (Oral)   Resp 14   Ht 5\' 2"  (1.575 m)   Wt 165 lb (74.8 kg)   SpO2 99%   BMI 30.18 kg/m  General appearance: alert, cooperative, appears stated age and no distress Head:  Normocephalic, without obvious abnormality, atraumatic Throat: lips, mucosa, and tongue normal; teeth and gums normal Lungs: clear to auscultation bilaterally Heart: regular rate and rhythm, S1, S2 normal, no murmur, click, rub or gallop Extremities: extremities normal, atraumatic, no cyanosis or edema Pulses: 2+ and symmetric Skin: Skin color, texture, turgor normal. No rashes or lesions Neurologic: Alert and oriented X 3, normal strength and tone. Normal symmetric reflexes. Normal coordination and gait   Assessment/Plan: 1. Type 2 diabetes mellitus without complication, without long-term current use of insulin (HCC) Stopped metformin due to side effect. Working hard on diet and exercise. Will repeat labs today. Will consider starting low-dose SGLT2 but will wait for results. She is scheduling eye exam. Pneumovax given today. Scheduled for CPE in March. - Lipid panel - Comprehensive metabolic panel - Hemoglobin A1c - TSH  2. Breast cancer screening Due. Order placed for screening mammogram. - MM DIGITAL SCREENING BILATERAL; Future  3. Colon cancer screening Overdue. Average risk. Asymptomatic. Referral to GI placed to set up screening colonoscopy. - Ambulatory referral to Gastroenterology  4. Tobacco use Ready to quit: Yes Counseling given: Yes Comment: smokes 3-4 some days  - varenicline (CHANTIX PAK) 0.5 MG X 11 & 1 MG X 42 tablet; Take one 0.5 mg tablet by mouth once daily for 3 days, then increase to one 0.5 mg tablet twice daily for 4 days, then increase to one 1 mg tablet twice daily.  Dispense: 53 tablet; Refill: 0  5. Need for 23-polyvalent pneumococcal polysaccharide vaccine - Pneumococcal polysaccharide vaccine 23-valent greater than or equal to 2yo subcutaneous/IM

## 2018-10-12 ENCOUNTER — Other Ambulatory Visit: Payer: Self-pay | Admitting: Physician Assistant

## 2018-10-12 DIAGNOSIS — E119 Type 2 diabetes mellitus without complications: Secondary | ICD-10-CM

## 2018-10-12 DIAGNOSIS — E1169 Type 2 diabetes mellitus with other specified complication: Secondary | ICD-10-CM

## 2018-10-12 DIAGNOSIS — E785 Hyperlipidemia, unspecified: Secondary | ICD-10-CM

## 2018-10-12 MED ORDER — PRAVASTATIN SODIUM 20 MG PO TABS: 20.0000 mg | ORAL_TABLET | Freq: Every day | ORAL | 3 refills | Status: DC

## 2018-10-12 MED ORDER — EMPAGLIFLOZIN 10 MG PO TABS: 10.0000 mg | ORAL_TABLET | Freq: Every day | ORAL | 3 refills | Status: DC

## 2018-10-15 ENCOUNTER — Ambulatory Visit: Payer: Self-pay

## 2018-10-15 MED ORDER — DOXYCYCLINE HYCLATE 100 MG PO CAPS: 100.0000 mg | ORAL_CAPSULE | Freq: Two times a day (BID) | ORAL | 0 refills | Status: DC

## 2018-10-15 NOTE — Telephone Encounter (Signed)
Advised patient that Doxycycline was sent to the pharmacy for symptoms. Continue albuterol for chest tightness and wheezing, stay well hydrated and get plenty of rest. She is agreeable.

## 2018-10-15 NOTE — Telephone Encounter (Signed)
Continue supportive measures discussed at visit.  Increase fluids and get plenty of rest.  Continue albuterol as directed, if needed for chest tightness/wheezing.  I have sent in Rx Doxycycline.  Follow-up If symptoms are not resolving.

## 2018-10-15 NOTE — Telephone Encounter (Signed)
Pt. Reports she was treated for bronchitis 09/26/18. Was completely better, then Friday she started coughing again.Stes "it's pretty severe coughing with light yellow mucus.I'm having wheezing again too." Started back on Mucinex and is using her inhaler as ordered. Offered an appointment. Request antibiotic be sent to her pharmacy.Please advise pt.  Answer Assessment - Initial Assessment Questions 1. ONSET: "When did the cough begin?"      This past Friday 2. SEVERITY: "How bad is the cough today?"      Severe 3. RESPIRATORY DISTRESS: "Describe your breathing."      Using inhaler 4. FEVER: "Do you have a fever?" If so, ask: "What is your temperature, how was it measured, and when did it start?"     No 5. SPUTUM: "Describe the color of your sputum" (clear, white, yellow, green)     Light yellow 6. HEMOPTYSIS: "Are you coughing up any blood?" If so ask: "How much?" (flecks, streaks, tablespoons, etc.)     No 7. CARDIAC HISTORY: "Do you have any history of heart disease?" (e.g., heart attack, congestive heart failure)      No  8. LUNG HISTORY: "Do you have any history of lung disease?"  (e.g., pulmonary embolus, asthma, emphysema)     Asthma 9. PE RISK FACTORS: "Do you have a history of blood clots?" (or: recent major surgery, recent prolonged travel, bedridden)     No 10. OTHER SYMPTOMS: "Do you have any other symptoms?" (e.g., runny nose, wheezing, chest pain)       Wheezing 11. PREGNANCY: "Is there any chance you are pregnant?" "When was your last menstrual period?"       No  12. TRAVEL: "Have you traveled out of the country in the last month?" (e.g., travel history, exposures)       No  Protocols used: COUGH - ACUTE PRODUCTIVE-A-AH

## 2018-10-24 ENCOUNTER — Encounter: Payer: Self-pay | Admitting: Gastroenterology

## 2018-11-14 ENCOUNTER — Other Ambulatory Visit: Payer: Self-pay

## 2018-11-14 ENCOUNTER — Ambulatory Visit (AMBULATORY_SURGERY_CENTER): Payer: Self-pay

## 2018-11-14 VITALS — Ht 62.0 in | Wt 169.2 lb

## 2018-11-14 DIAGNOSIS — Z1211 Encounter for screening for malignant neoplasm of colon: Secondary | ICD-10-CM

## 2018-11-14 NOTE — Progress Notes (Signed)
No egg or soy allergy known to patient  No issues with past sedation with any surgeries  or procedures, no intubation problems  No diet pills per patient No home 02 use per patient  No blood thinners per patient  Pt denies issues with constipation  No A fib or A flutter  EMMI video sent to pt's e mail , pt declined    

## 2018-11-19 ENCOUNTER — Ambulatory Visit
Admission: RE | Admit: 2018-11-19 | Discharge: 2018-11-19 | Disposition: A | Discharge status: Home / Self Care | Payer: PRIVATE HEALTH INSURANCE | Source: Ambulatory Visit | Attending: Physician Assistant | Admitting: Physician Assistant

## 2018-11-19 DIAGNOSIS — Z1239 Encounter for other screening for malignant neoplasm of breast: Secondary | ICD-10-CM

## 2018-11-26 ENCOUNTER — Other Ambulatory Visit: Payer: Self-pay

## 2018-11-26 ENCOUNTER — Telehealth: Payer: Self-pay | Admitting: Gastroenterology

## 2018-11-26 ENCOUNTER — Other Ambulatory Visit (INDEPENDENT_AMBULATORY_CARE_PROVIDER_SITE_OTHER): Payer: PRIVATE HEALTH INSURANCE

## 2018-11-26 ENCOUNTER — Encounter: Payer: Self-pay | Admitting: *Deleted

## 2018-11-26 DIAGNOSIS — Z1211 Encounter for screening for malignant neoplasm of colon: Secondary | ICD-10-CM

## 2018-11-26 DIAGNOSIS — E785 Hyperlipidemia, unspecified: Secondary | ICD-10-CM | POA: Diagnosis not present

## 2018-11-26 DIAGNOSIS — E1169 Type 2 diabetes mellitus with other specified complication: Secondary | ICD-10-CM | POA: Diagnosis not present

## 2018-11-26 LAB — HEPATIC FUNCTION PANEL
ALT: 11 U/L (ref 0–35)
AST: 9 U/L (ref 0–37)
Albumin: 3.9 g/dL (ref 3.5–5.2)
Alkaline Phosphatase: 59 U/L (ref 39–117)
Bilirubin, Direct: 0.1 mg/dL (ref 0.0–0.3)
Total Bilirubin: 0.2 mg/dL (ref 0.2–1.2)
Total Protein: 6.7 g/dL (ref 6.0–8.3)

## 2018-11-26 LAB — LIPID PANEL
Cholesterol: 176 mg/dL (ref 0–200)
HDL: 44.6 mg/dL (ref >39.00)
LDL Cholesterol: 115 mg/dL — ABNORMAL HIGH (ref 0–99)
NonHDL: 131.89
Total CHOL/HDL Ratio: 4
Triglycerides: 85 mg/dL (ref 0.0–149.0)
VLDL: 17 mg/dL (ref 0.0–40.0)

## 2018-11-26 MED ORDER — PEG-KCL-NACL-NASULF-NA ASC-C 140 G PO SOLR: 1.0000 | ORAL | 0 refills | Status: DC

## 2018-11-26 NOTE — Addendum Note (Signed)
Addended by: Gillermina Hu on: 11/26/2018 09:42 AM   Modules accepted: Orders

## 2018-11-26 NOTE — Telephone Encounter (Signed)
Ambulatory referral and Plenvu placed in chart- [facility]

## 2018-11-26 NOTE — Telephone Encounter (Signed)
Pt is scheduled for colon 3.30.20 and [facility]

## 2018-11-28 ENCOUNTER — Encounter: Payer: Self-pay | Admitting: Gastroenterology

## 2018-11-28 ENCOUNTER — Other Ambulatory Visit: Payer: Self-pay | Admitting: Physician Assistant

## 2018-11-28 DIAGNOSIS — E785 Hyperlipidemia, unspecified: Principal | ICD-10-CM

## 2018-11-28 DIAGNOSIS — E1169 Type 2 diabetes mellitus with other specified complication: Secondary | ICD-10-CM

## 2018-11-28 MED ORDER — PEG-KCL-NACL-NASULF-NA ASC-C 140 G PO SOLR: 1.0000 | ORAL | 0 refills | Status: DC

## 2018-11-28 MED ORDER — PRAVASTATIN SODIUM 40 MG PO TABS: 40.0000 mg | ORAL_TABLET | Freq: Every day | ORAL | 1 refills | Status: DC

## 2018-12-03 ENCOUNTER — Encounter: Payer: Self-pay | Admitting: Emergency Medicine

## 2018-12-05 ENCOUNTER — Telehealth: Payer: Self-pay | Admitting: *Deleted

## 2018-12-05 NOTE — Telephone Encounter (Signed)
LM on VM informing patient that her procedure for 3/30 is cancelled d/t covid 19.  Asked her to call back to confirm.

## 2018-12-10 ENCOUNTER — Encounter: Payer: Self-pay | Admitting: Gastroenterology

## 2018-12-18 ENCOUNTER — Telehealth: Payer: Self-pay | Admitting: Physician Assistant

## 2018-12-18 DIAGNOSIS — Z720 Tobacco use: Secondary | ICD-10-CM

## 2018-12-18 DIAGNOSIS — R062 Wheezing: Secondary | ICD-10-CM

## 2018-12-18 NOTE — Telephone Encounter (Signed)
Reviewing Metrics. Patient is overdue for diabetic eye examination. Will you please see if she has had in the past 12 months. If so can we get records? If not, will need to place referral and they will schedule her once things calm down.   Patient is also due for her Colonoscopy. We have not been able to discuss yet in office. We will discuss at next visit as screening colonoscopies are not being done currently due to COVID pandemic.

## 2018-12-19 MED ORDER — ALBUTEROL SULFATE HFA 108 (90 BASE) MCG/ACT IN AERS: INHALATION_SPRAY | RESPIRATORY_TRACT | 3 refills | Status: DC

## 2018-12-19 MED ORDER — VARENICLINE TARTRATE 0.5 MG X 11 & 1 MG X 42 PO MISC: ORAL | 0 refills | Status: DC

## 2018-12-19 NOTE — Telephone Encounter (Signed)
Refills have been sent in. For the Chantix I refilled the starting pack since she has been out for a while. When she needs next refill, we will need to send in continuing pack.

## 2018-12-19 NOTE — Telephone Encounter (Signed)
Spoke with patient. She had eye exam and colonoscopy scheduled but has been cancelled due to Covid-19 Patient requested refill on Chantix and albuterol inhaler. She denies any sob, fever or cough.

## 2019-01-15 ENCOUNTER — Telehealth: Payer: Self-pay | Admitting: *Deleted

## 2019-01-15 NOTE — Telephone Encounter (Signed)
Called pt to RS screen colon with Dr Myrtie Neither- Pt states she was fur lowed from her job at [employer] and she will start back Monday 5-11 but instead of 3- 12 hour days she will have to work 5- 8 hour days and she isnr sure how long that will last until she is back to her regular schedule so she wishes to call us back later in the summer to RS once she sees how her job will go.  Instructed her to call when she is ready to RS- she states she has no issues, no [facility]  -  Bufford Spikes RN

## 2019-01-29 ENCOUNTER — Other Ambulatory Visit: Payer: Self-pay | Admitting: Physician Assistant

## 2019-01-29 DIAGNOSIS — Z720 Tobacco use: Secondary | ICD-10-CM

## 2019-01-30 MED ORDER — VARENICLINE TARTRATE 1 MG PO TABS: 1.0000 mg | ORAL_TABLET | Freq: Two times a day (BID) | ORAL | 3 refills | Status: DC

## 2019-02-05 ENCOUNTER — Other Ambulatory Visit: Payer: Self-pay | Admitting: Physician Assistant

## 2019-02-05 DIAGNOSIS — E119 Type 2 diabetes mellitus without complications: Secondary | ICD-10-CM

## 2019-06-15 ENCOUNTER — Other Ambulatory Visit: Payer: Self-pay | Admitting: Physician Assistant

## 2019-06-15 DIAGNOSIS — E785 Hyperlipidemia, unspecified: Secondary | ICD-10-CM

## 2019-06-15 DIAGNOSIS — E1169 Type 2 diabetes mellitus with other specified complication: Secondary | ICD-10-CM

## 2019-06-15 DIAGNOSIS — E119 Type 2 diabetes mellitus without complications: Secondary | ICD-10-CM

## 2019-06-17 NOTE — Telephone Encounter (Signed)
Patient is scheduled for an follow up appointment on 06/27/19

## 2019-06-27 ENCOUNTER — Encounter: Payer: Self-pay | Admitting: Physician Assistant

## 2019-06-27 ENCOUNTER — Ambulatory Visit (INDEPENDENT_AMBULATORY_CARE_PROVIDER_SITE_OTHER): Payer: PRIVATE HEALTH INSURANCE | Admitting: Physician Assistant

## 2019-06-27 ENCOUNTER — Other Ambulatory Visit: Payer: Self-pay

## 2019-06-27 VITALS — BP 124/76 | HR 76 | Temp 98.5°F | Resp 14 | Ht 62.0 in | Wt 159.0 lb

## 2019-06-27 DIAGNOSIS — B353 Tinea pedis: Secondary | ICD-10-CM | POA: Diagnosis not present

## 2019-06-27 DIAGNOSIS — E663 Overweight: Secondary | ICD-10-CM

## 2019-06-27 DIAGNOSIS — E119 Type 2 diabetes mellitus without complications: Secondary | ICD-10-CM | POA: Diagnosis not present

## 2019-06-27 DIAGNOSIS — Z7689 Persons encountering health services in other specified circumstances: Secondary | ICD-10-CM

## 2019-06-27 LAB — COMPREHENSIVE METABOLIC PANEL
ALT: 12 U/L (ref 0–35)
AST: 12 U/L (ref 0–37)
Albumin: 4.2 g/dL (ref 3.5–5.2)
Alkaline Phosphatase: 67 U/L (ref 39–117)
BUN: 9 mg/dL (ref 6–23)
CO2: 28 mEq/L (ref 19–32)
Calcium: 9.4 mg/dL (ref 8.4–10.5)
Chloride: 105 mEq/L (ref 96–112)
Creatinine, Ser: 0.63 mg/dL (ref 0.40–1.20)
GFR: 120.55 mL/min (ref >60.00)
Glucose, Bld: 86 mg/dL (ref 70–99)
Potassium: 4.5 mEq/L (ref 3.5–5.1)
Sodium: 139 mEq/L (ref 135–145)
Total Bilirubin: 0.3 mg/dL (ref 0.2–1.2)
Total Protein: 6.9 g/dL (ref 6.0–8.3)

## 2019-06-27 LAB — LIPID PANEL
Cholesterol: 160 mg/dL (ref 0–200)
HDL: 45.5 mg/dL (ref >39.00)
LDL Cholesterol: 101 mg/dL — ABNORMAL HIGH (ref 0–99)
NonHDL: 114.57
Total CHOL/HDL Ratio: 4
Triglycerides: 68 mg/dL (ref 0.0–149.0)
VLDL: 13.6 mg/dL (ref 0.0–40.0)

## 2019-06-27 LAB — MICROALBUMIN / CREATININE URINE RATIO
Creatinine,U: 90.1 mg/dL
Microalb Creat Ratio: 1.1 mg/g (ref 0.0–30.0)
Microalb, Ur: 1 mg/dL (ref 0.0–1.9)

## 2019-06-27 LAB — HEMOGLOBIN A1C: Hgb A1c MFr Bld: 6.5 % (ref 4.6–6.5)

## 2019-06-27 LAB — HM DIABETES FOOT EXAM: HM Diabetic Foot Exam: NORMAL

## 2019-06-27 MED ORDER — PHENTERMINE HCL 30 MG PO CAPS: 30.0000 mg | ORAL_CAPSULE | ORAL | 1 refills | Status: DC

## 2019-06-27 MED ORDER — NAFTIFINE HCL 1 % EX CREA: TOPICAL_CREAM | Freq: Every day | CUTANEOUS | 0 refills | Status: DC

## 2019-06-27 NOTE — Patient Instructions (Signed)
Please go to the lab today for blood work.  I will call you with your results. We will alter treatment regimen(s) if indicated by your results.   Please continue current medication regimen. Make sure to watch those processed foods, especially the sweets -- lot of sugar and sodium in those which we need to avoid.   Keep well-hydrated.  I have sent in a prescription cream to apply daily to the feet over the next 2 weeks. Also start soaking feet in a basin of quart of water and 1/3 cup distilled white vinegar for 10-15 minutes. Do this a few times per week.   Let me know if things aren't resolving.

## 2019-06-27 NOTE — Progress Notes (Signed)
History of Present Illness: Patient is a 53 y.o. female who presents to clinic today for follow-up of Diabetes Mellitus II, controlled without complication.  Patient currently on medication regimen of Jardiance 10 mg QD.  Endorses taking medications as directed. Endorses trying to watch diet -- portions are good but does like her sweets. Is doing weight watchers. Is trying to stay active -- exercising at home. Previously in the gym but not going now due to [place].   Latest Maintenance: A1C --  Lab Results  Component Value Date   HGBA1C 6.5 10/08/2018   Diabetic Eye Exam -- Scheduled for eye exam next Saturday. . Urine Microalbumin -- Due Foot Exam -- Due. Denies sores. Notes significant itching of the feet bilaterally that is chronic. Is using OTC Lotrimin and [place] diabetic cream without much improvement.    Due for PAP. Referral to GYN placed.  Patient initially had colonoscopy scheduled but was canceled due to Covid.  She is in the process of rescheduling this but has been a little bit difficult due to her work schedule.  Patient asymptomatic.  Average risk.  Patient would like to discuss potential for medication to assist in weight loss.  She is keeping active and watching her intake overall as noted above.  Feels that her appetite is quite significant.  Is wanting to discuss potential options to help with this.  Past Medical History:  Diagnosis Date  . Asthma   . Diabetes mellitus without complication ([place])   . History of chickenpox   . History of prediabetes   . Hyperlipidemia     Current Outpatient Medications on File Prior to Visit  Medication Sig Dispense Refill  . albuterol (PROVENTIL HFA;VENTOLIN HFA) 108 (90 Base) MCG/ACT inhaler TAKE 2 PUFFS BY MOUTH EVERY 6 HOURS AS NEEDED FOR WHEEZE OR SHORTNESS OF BREATH 18 g 3  . fluticasone (FLOVENT HFA) 220 MCG/ACT inhaler Inhale 1 puff into the lungs 2 (two) times daily. 1 Inhaler 12  . JARDIANCE 10 MG TABS tablet TAKE 1  TABLET BY MOUTH EVERY DAY 30 tablet 0  . pravastatin (PRAVACHOL) 40 MG tablet TAKE 1 TABLET BY MOUTH EVERY DAY 30 tablet 5  . PEG-KCl-NaCl-NaSulf-Na Asc-C (PLENVU) 140 g SOLR Take 1 kit by mouth as directed. (Patient not taking: Reported on 06/27/2019) 1 each 0  . PEG-KCl-NaCl-NaSulf-Na Asc-C (PLENVU) 140 g SOLR Take 1 kit by mouth as directed. (Patient not taking: Reported on 06/27/2019) 1 each 0   No current facility-administered medications on file prior to visit.     Allergies  Allergen Reactions  . Penicillins Rash    Family History  Problem Relation Age of Onset  . Asthma Mother   . Stroke Mother   . Kidney disease Father   . Alcohol abuse Father   . Hypertension Sister   . Diabetes Brother   . Colon cancer Neg Hx   . Esophageal cancer Neg Hx   . Rectal cancer Neg Hx   . Stomach cancer Neg Hx     Social History   Socioeconomic History  . Marital status: Married    Spouse name: Not on file  . Number of children: Not on file  . Years of education: Not on file  . Highest education level: Not on file  Occupational History  . Occupation: [employer]  . Financial resource strain: Not on file  . Food insecurity    Worry: Not on file    Inability: Not on file  .  Transportation needs    Medical: Not on file    Non-medical: Not on file  Tobacco Use  . Smoking status: Current Every Day Smoker    Packs/day: 0.25    Types: Cigarettes  . Smokeless tobacco: Never Used  . Tobacco comment: quit February 2020  Substance and Sexual Activity  . Alcohol use: No    Frequency: Never  . Drug use: No  . Sexual activity: Yes    Birth control/protection: Surgical  Lifestyle  . Physical activity    Days per week: Not on file    Minutes per session: Not on file  . Stress: Not on file  Relationships  . Social Herbalist on phone: Not on file    Gets together: Not on file    Attends religious service: Not on file    Active member of club or  organization: Not on file    Attends meetings of clubs or organizations: Not on file    Relationship status: Not on file  Other Topics Concern  . Not on file  Social History Narrative  . Not on file   Review of Systems: Pertinent ROS are listed in HPI  Physical Examination: BP 124/76   Pulse 76   Temp 98.5 F (36.9 C) (Temporal)   Resp 14   Ht _0  (1.575 m)   Wt 159 lb (72.1 kg)   SpO2 99%   BMI 29.08 kg/m  General appearance: alert, cooperative, appears stated age and no distress Lungs: clear to auscultation bilaterally Heart: regular rate and rhythm, S1, S2 normal, no murmur, click, rub or gallop Extremities: extremities normal, atraumatic, no cyanosis or edema Pulses: 2+ and symmetric Skin: Skin color, texture, turgor normal. No rashes or lesions Lymph nodes: Cervical, supraclavicular, and axillary nodes normal. Neurologic: Alert and oriented X 3, normal strength and tone. Normal symmetric reflexes. Normal coordination and gait  Diabetic Foot Form - Detailed   Diabetic Foot Exam - detailed Diabetic Foot exam was performed with the following findings: Yes 06/27/2019  9:40 AM  Visual Foot Exam completed.: Yes  Can the patient see the bottom of their feet?: Yes Are the shoes appropriate in style and fit?: Yes Is there swelling or and abnormal foot shape?: No Is there a claw toe deformity?: No Is there elevated skin temparature?: No Is there foot or ankle muscle weakness?: No Normal Range of Motion: Yes Pulse Foot Exam completed.: Yes  Right posterior Tibialias: Present Left posterior Tibialias: Present  Right Dorsalis Pedis: Present Left Dorsalis Pedis: Present  Sensory Foot Exam Completed.: Yes Semmes-Weinstein Monofilament Test R Site 1-Great Toe: Pos L Site 1-Great Toe: Pos    Comments: Some scaling of feet noted without maceration, concerning for mild but continued tinea    Assessment/Plan: 1. Type 2 diabetes mellitus without complication, without long-term  current use of insulin (HCC) Taking medications as directed.  Reviewed dietary and exercise recommendations.  Foot exam updated today.  Mild tinea noted but otherwise unremarkable.  Good sensation.  Has diabetic eye examination scheduled.  Immunizations are up-to-date.  We will repeat fasting labs today.  We will alter regimen accordingly. - Hemoglobin A1c - Comp Met (CMET) - Urine Microalbumin w/creat. ratio - Lipid panel  2. Tinea pedis of both feet Home foot soaks with distilled white vinegar discussed with patient.  Rx Naftin cream to use daily for 2 weeks.  Follow-up if not resolving.  3. Overweight (BMI 25.0-29.9) Dietary and exercise recommendations again reviewed with patient.  She is doing very well in terms of food choices and portion sizes for her meals.  Snacking in the evening seems to be her biggest problem.  Notes significant appetite.  We will have her continue working on diet and keep up with her aerobic exercise weekly.  Will start trial of phentermine 30 mg once daily.  Follow-up discussed.  4. Referral of patient Referral to GYN placed for her routine gynecological care, per patient preference. - Ambulatory referral to Gynecology

## 2019-06-28 ENCOUNTER — Other Ambulatory Visit: Payer: Self-pay | Admitting: *Deleted

## 2019-06-28 DIAGNOSIS — E1169 Type 2 diabetes mellitus with other specified complication: Secondary | ICD-10-CM

## 2019-06-28 DIAGNOSIS — E785 Hyperlipidemia, unspecified: Secondary | ICD-10-CM

## 2019-06-28 MED ORDER — PRAVASTATIN SODIUM 80 MG PO TABS: 80.0000 mg | ORAL_TABLET | Freq: Every day | ORAL | 0 refills | Status: DC

## 2019-07-12 ENCOUNTER — Other Ambulatory Visit: Payer: Self-pay | Admitting: Physician Assistant

## 2019-07-12 DIAGNOSIS — E119 Type 2 diabetes mellitus without complications: Secondary | ICD-10-CM

## 2019-08-14 LAB — HM PAP SMEAR: HM Pap smear: NEGATIVE

## 2019-08-28 ENCOUNTER — Other Ambulatory Visit: Payer: Self-pay

## 2019-08-28 ENCOUNTER — Ambulatory Visit (INDEPENDENT_AMBULATORY_CARE_PROVIDER_SITE_OTHER): Payer: PRIVATE HEALTH INSURANCE

## 2019-08-28 DIAGNOSIS — E785 Hyperlipidemia, unspecified: Secondary | ICD-10-CM

## 2019-08-28 DIAGNOSIS — E1169 Type 2 diabetes mellitus with other specified complication: Secondary | ICD-10-CM | POA: Diagnosis not present

## 2019-08-28 LAB — LIPID PANEL
Cholesterol: 176 mg/dL (ref 0–200)
HDL: 51.3 mg/dL (ref >39.00)
LDL Cholesterol: 112 mg/dL — ABNORMAL HIGH (ref 0–99)
NonHDL: 124.41
Total CHOL/HDL Ratio: 3
Triglycerides: 64 mg/dL (ref 0.0–149.0)
VLDL: 12.8 mg/dL (ref 0.0–40.0)

## 2019-08-28 LAB — HEPATIC FUNCTION PANEL
ALT: 14 U/L (ref 0–35)
AST: 13 U/L (ref 0–37)
Albumin: 4.2 g/dL (ref 3.5–5.2)
Alkaline Phosphatase: 66 U/L (ref 39–117)
Bilirubin, Direct: 0.1 mg/dL (ref 0.0–0.3)
Total Bilirubin: 0.5 mg/dL (ref 0.2–1.2)
Total Protein: 6.9 g/dL (ref 6.0–8.3)

## 2019-08-29 ENCOUNTER — Encounter: Payer: Self-pay | Admitting: Emergency Medicine

## 2019-10-16 ENCOUNTER — Telehealth: Payer: Self-pay

## 2019-10-16 NOTE — Telephone Encounter (Signed)
Laura Santana with [employer] called in stating that she had reached out to [facility] for prior auth on Farxiga and they are unable to find a current membership with patient.LMOVM advising patient to call back with updated insurance information.

## 2019-10-17 ENCOUNTER — Other Ambulatory Visit: Payer: Self-pay | Admitting: *Deleted

## 2019-10-17 DIAGNOSIS — E1169 Type 2 diabetes mellitus with other specified complication: Secondary | ICD-10-CM

## 2019-10-17 MED ORDER — PRAVASTATIN SODIUM 80 MG PO TABS: 80.0000 mg | ORAL_TABLET | Freq: Every day | ORAL | 0 refills | Status: DC

## 2019-10-17 NOTE — Telephone Encounter (Signed)
Trying to start prior authorization for patient Laura Santana medication but insurance Subscriber # does not want to go thru

## 2019-10-17 NOTE — Telephone Encounter (Signed)
Insurance information updated  Pt will come back today with insurance card  For copy

## 2019-10-21 ENCOUNTER — Other Ambulatory Visit: Payer: Self-pay | Admitting: Emergency Medicine

## 2019-10-21 DIAGNOSIS — E119 Type 2 diabetes mellitus without complications: Secondary | ICD-10-CM

## 2019-10-21 MED ORDER — JARDIANCE 10 MG PO TABS: 10.0000 mg | ORAL_TABLET | Freq: Every day | ORAL | 6 refills | Status: DC

## 2019-10-21 NOTE — Telephone Encounter (Signed)
Unsure why the pharmacy keeps requesting a prior authorization when the medication is covered by patient insurance [employer]. Reviewed [employer] medication formulary 2021 and [place] Pepper is Tier 1 quantity limit of 30/30. Unsure why needed so, [facility]fs and started another prior authorization: [facility] [id number]

## 2019-10-21 NOTE — Telephone Encounter (Signed)
Pt is out of Jardiance, has been out for 2 weeks. She is calling for an update on the PA. I confirmed her insurance ID and Group # are correct, PA phone # on the card is [phone]. Please call pt with update [phone]

## 2019-10-22 ENCOUNTER — Other Ambulatory Visit: Payer: Self-pay | Admitting: Physician Assistant

## 2019-10-22 MED ORDER — FARXIGA 10 MG PO TABS: 5.0000 mg | ORAL_TABLET | Freq: Every day | ORAL | 1 refills | Status: DC

## 2019-10-22 NOTE — Telephone Encounter (Signed)
Spoke with patient about her Jardiance medication. She does have 1 month left of Jardiance. Per insurance they will cover [place]. PCP switched Jardiance to [place] 10 mg and will have her take 0.5 mg daily. Follow up in 1 month.

## 2019-11-05 ENCOUNTER — Telehealth: Payer: Self-pay | Admitting: *Deleted

## 2019-11-05 NOTE — Telephone Encounter (Signed)
Pt stated was put in a new Rx Farxiga 5mg , started  medication 4 days ago  Having new Sx possible from medication, shaking dizziness and  nausea  Dizziness started after taking medication. Today started with nausea and shakiness  Pt been taking medication daily as prescribe, taking in the morning. Stated she is feeling like her glucose is dropping. Pt unable to check glucose  Transfer phone to triage

## 2019-11-05 NOTE — Telephone Encounter (Signed)
I do not see triage note put in yet. Please monitor for this to make sure this was done.

## 2019-11-06 ENCOUNTER — Telehealth: Payer: Self-pay | Admitting: Emergency Medicine

## 2019-11-06 NOTE — Telephone Encounter (Signed)
Still no triage note entered but after reviewing pt's chart, last A1C was 6.5.  Pt needs to hold [place] and schedule appt w/ PCP when he returns on Friday.

## 2019-11-06 NOTE — Telephone Encounter (Signed)
[facility] Day TELEPHONE ADVICE RECORD [facility] Patient Name: Laura Santana Gender: Female DOB: [birth year] Age: 54 Y 4 M 8 D Return Phone Number: [phone] (Primary) [place] [address] Client [facility] Day Client Site  [facility] [place] - Day Physician Malva Cogan - [place] Contact Type Call Who Is Calling Patient / Member / Family / Caregiver Call Type Triage / Clinical Relationship To Patient Self Return Phone Number [phone] (Primary) Chief Complaint Dizziness Reason for Call Symptomatic / Request for Health Information Initial Comment Caller is feeling dizzy, shaky and nausea that could be side effects from a new medication. Translation No Nurse Assessment Nurse: Yetta Barre, RN, Miranda Date/Time (Eastern Time): 11/05/2019 1:48:05 PM Confirm and document reason for call. If symptomatic, describe symptoms. ---Caller states Friday, she started taking a new medication, Marcelline Deist, and afterwards, she started feeling shaky, dizzy, and nauseated. She does not have a way to check her BS. Symptoms were worse today. Has the patient had close contact with a person known or suspected to have the novel coronavirus illness OR traveled / lives in area with major community spread (including international travel) in the last 14 days from the onset of symptoms? * If Asymptomatic, screen for exposure and travel within the last 14 days. ---No Does the patient have any new or worsening symptoms? ---Yes Will a triage be completed? ---Yes Related visit to physician within the last 2 weeks? ---No Does the PT have any chronic conditions? (i.e. diabetes, asthma, this includes High risk factors for pregnancy, etc.) ---Yes List chronic conditions. ---Diabetes, High Cholesterol Is the patient pregnant or possibly pregnant? (Ask all females between the ages of 20-55) ---No Is this a  behavioral health or substance abuse call? ---No Guidelines Guideline Title Affirmed Question Affirmed Notes Nurse Date/Time (Eastern Time) Diabetes - Low Blood Sugar [1] Caller has NONURGENT medication or insulin pump question Yetta Barre, RN, Miranda 11/05/2019 1:51:32 PM PLEASE NOTE: All timestamps contained within this report are represented as [place] Standard Time. CONFIDENTIALTY NOTICE: This fax transmission is intended only for the addressee. It contains information that is legally privileged, confidential or otherwise protected from use or disclosure. If you are not the intended recipient, you are strictly prohibited from reviewing, disclosing, copying using or disseminating any of this information or taking any action in reliance on or regarding this information. If you have received this fax in error, please notify us immediately by telephone so that we can arrange for its return to [place]. Phone: [phone], Toll-Free: [phone], Fax: [phone] Page: 2 of 2 Call Id: [id number] Guidelines Guideline Title Affirmed Question Affirmed Notes Nurse Date/Time Lamount Cohen Time) AND [2] triager unable to answer question Disp. Time Lamount Cohen Time) Disposition Final User 11/05/2019 1:53:59 PM Call PCP within 24 Hours Yes Yetta Barre, RN, Tamera Punt Caller Disagree/Comply Comply Caller Understands Yes  PreDisposition Call Doctor Care Advice Given Per Guideline CALL PCP WITHIN 24 HOURS: * You need to discuss this with your doctor (or NP/PA) within the next 24 hours. * IF OFFICE WILL BE OPEN: Call the office when it opens tomorrow morning. CALL BACK IF: * You have more questions. CARE ADVICE given per Diabetes - Low Blood Sugar (Adult) guideline. * You become worse. Comments User: Grier Rocher, RN Date/Time (Eastern Time): 11/05/2019 2:02:55 PM Called backline and spoke with Francena Hanly and she said there is no provider in the office today. She will get the message to the provider in the morning

## 2019-11-06 NOTE — Telephone Encounter (Signed)
Pt Advised Per Dr Beverely Low Laura Santana till schedule apt with PCP when he return on Friday  Appointment schedule for Friday  Pt stated have not taken medication at this time and will hold Farxiga till apt. Stated her BP been running 177/102 - 155/98 x 1 week  This morning was 133/88 . Pt asymptomatic. Will continue monitoring BP till apt.

## 2019-11-08 ENCOUNTER — Other Ambulatory Visit: Payer: Self-pay

## 2019-11-08 ENCOUNTER — Ambulatory Visit: Payer: Managed Care, Other (non HMO) | Admitting: Physician Assistant

## 2019-11-08 DIAGNOSIS — Z0289 Encounter for other administrative examinations: Secondary | ICD-10-CM

## 2019-11-15 ENCOUNTER — Ambulatory Visit (INDEPENDENT_AMBULATORY_CARE_PROVIDER_SITE_OTHER): Payer: Managed Care, Other (non HMO) | Admitting: Physician Assistant

## 2019-11-15 ENCOUNTER — Other Ambulatory Visit: Payer: Self-pay

## 2019-11-15 ENCOUNTER — Encounter: Payer: Self-pay | Admitting: Physician Assistant

## 2019-11-15 VITALS — BP 140/82 | HR 91 | Temp 98.6°F | Resp 14 | Ht 62.0 in | Wt 159.0 lb

## 2019-11-15 DIAGNOSIS — I10 Essential (primary) hypertension: Secondary | ICD-10-CM

## 2019-11-15 DIAGNOSIS — E1159 Type 2 diabetes mellitus with other circulatory complications: Secondary | ICD-10-CM | POA: Diagnosis not present

## 2019-11-15 DIAGNOSIS — E119 Type 2 diabetes mellitus without complications: Secondary | ICD-10-CM

## 2019-11-15 DIAGNOSIS — I152 Hypertension secondary to endocrine disorders: Secondary | ICD-10-CM

## 2019-11-15 LAB — GLUCOSE, POCT (MANUAL RESULT ENTRY): POC Glucose: 91 mg/dl (ref 70–99)

## 2019-11-15 MED ORDER — HYDROCHLOROTHIAZIDE 12.5 MG PO CAPS: 12.5000 mg | ORAL_CAPSULE | Freq: Every day | ORAL | 2 refills | Status: DC

## 2019-11-15 NOTE — Progress Notes (Signed)
History of Present Illness: Patient is a 54 y.o. female who presents to clinic today for follow-up of Diabetes Mellitus II, controlled without known complication.  Patient currently on medication regimen of Farxiga 5 mg daily, changed from [place] due to insurance coverage.  Was taking medications as directed but noted feeling lightheaded and sick with the [place]. As such she stopped the medication. Endorses BP have been elevated up to 200/100 at one point over the past few weeks. Notes feeling "off" when BP is elevated. She uses a home wrist cuff.  Denies chest pain, palpitations, frequent headaches or vision changes.  Notes occasional swelling of legs.  Denies any PND orthopnea.  Has been following a low-sodium diet.  Latest Maintenance: A1C --  Lab Results  Component Value Date   HGBA1C 6.5 06/27/2019   Diabetic Eye Exam -- Due. Patient to schedule.  Foot Exam -- UTD  BP Readings from Last 3 Encounters:  11/15/19 140/82  06/27/19 124/76  10/08/18 122/80   Past Medical History:  Diagnosis Date  . Asthma   . Diabetes mellitus without complication ([place])   . History of chickenpox   . History of prediabetes   . Hyperlipidemia     Current Outpatient Medications on File Prior to Visit  Medication Sig Dispense Refill  . albuterol (PROVENTIL HFA;VENTOLIN HFA) 108 (90 Base) MCG/ACT inhaler TAKE 2 PUFFS BY MOUTH EVERY 6 HOURS AS NEEDED FOR WHEEZE OR SHORTNESS OF BREATH 18 g 3  . fluticasone (FLOVENT HFA) 220 MCG/ACT inhaler Inhale 1 puff into the lungs 2 (two) times daily. 1 Inhaler 12  . naftifine (NAFTIN) 1 % cream Apply topically daily. 30 g 0  . pravastatin (PRAVACHOL) 80 MG tablet Take 1 tablet (80 mg total) by mouth daily. 90 tablet 0  . dapagliflozin propanediol (FARXIGA) 10 MG TABS tablet Take 5 mg by mouth daily. (Patient not taking: Reported on 11/15/2019) 45 tablet 1   No current facility-administered medications on file prior to visit.    Allergies  Allergen Reactions    . Penicillins Rash    Family History  Problem Relation Age of Onset  . Asthma Mother   . Stroke Mother   . Kidney disease Father   . Alcohol abuse Father   . Hypertension Sister   . Diabetes Brother   . Colon cancer Neg Hx   . Esophageal cancer Neg Hx   . Rectal cancer Neg Hx   . Stomach cancer Neg Hx     Social History   Socioeconomic History  . Marital status: Married    Spouse name: Not on file  . Number of children: Not on file  . Years of education: Not on file  . Highest education level: Not on file  Occupational History  . Occupation: [employer]  . Smoking status: Current Every Day Smoker    Packs/day: 0.25    Types: Cigarettes  . Smokeless tobacco: Never Used  . Tobacco comment: quit February 2020  Substance and Sexual Activity  . Alcohol use: No  . Drug use: No  . Sexual activity: Yes    Birth control/protection: Surgical  Other Topics Concern  . Not on file  Social History Narrative  . Not on file   Social Determinants of Health   Financial Resource Strain:   . Difficulty of Paying Living Expenses: Not on file  Food Insecurity:   . Worried About Charity fundraiser in the Last Year: Not on file  . Ran  Out of Food in the Last Year: Not on file  Transportation Needs:   . Lack of Transportation (Medical): Not on file  . Lack of Transportation (Non-Medical): Not on file  Physical Activity:   . Days of Exercise per Week: Not on file  . Minutes of Exercise per Session: Not on file  Stress:   . Feeling of Stress : Not on file  Social Connections:   . Frequency of Communication with Friends and Family: Not on file  . Frequency of Social Gatherings with Friends and Family: Not on file  . Attends Religious Services: Not on file  . Active Member of Clubs or Organizations: Not on file  . Attends Banker Meetings: Not on file  . Marital Status: Not on file   Review of Systems: Pertinent ROS are listed in  HPI  Physical Examination: BP 140/82   Pulse 91   Temp 98.6 F (37 C) (Temporal)   Resp 14   Ht 5\' 2"  (1.575 m)   Wt 159 lb (72.1 kg)   SpO2 99%   BMI 29.08 kg/m  General appearance: alert, cooperative, appears stated age and no distress Lungs: clear to auscultation bilaterally Heart: regular rate and rhythm, S1, S2 normal, no murmur, click, rub or gallop Extremities: extremities normal, atraumatic, no cyanosis or edema Pulses: 2+ and symmetric Neurologic: Alert and oriented X 3, normal strength and tone. Normal symmetric reflexes. Normal coordination and gait  Assessment/Plan: 1. Type 2 diabetes mellitus without complication, without long-term current use of insulin (HCC) Nonfasting CBG obtained today during visit.  Glucose at 91.  Will hold off on any other antidiabetic agents.  We will repeat labs today to include CBC, BMP and A1c.  We will make adjustments based on these results. - POCT Glucose (CBG) - Basic metabolic panel - CBC w/Diff - Hemoglobin A1c  2. Hypertension associated with diabetes (HCC) BP mildly elevated at 140/82 today in office.  Remains the same on recheck.  Significant elevations in blood pressures at home.  Also significantly elevated blood pressure checked manually at work.  With occasional swelling, will start her on HCTZ 12.5 mg daily.  Examination unremarkable today.  EKG obtained revealing normal sinus rhythm.  Labs today as listed.  Continue DASH diet.  Continue home ambulatory blood pressure readings.  Follow-up in 2 weeks. - EKG 12-Lead - Basic metabolic panel - CBC w/Diff - Hemoglobin A1c   This visit occurred during the SARS-CoV-2 public health emergency.  Safety protocols were in place, including screening questions prior to the visit, additional usage of staff PPE, and extensive cleaning of exam room while observing appropriate contact time as indicated for disinfecting solutions.

## 2019-11-15 NOTE — Patient Instructions (Addendum)
Please keep hydrated and get plenty of rest.  Continue low-salt diet. Limit any gatorade consumption for now.  Start the Hydrochlorothiazide once daily. Check BP daily and record.  We will follow-up in 2 weeks to reassess BP levels.  For now I am keeping you off of any diabetic medication as your glucose today looks good without medication. I am checking A1C to further assess.    DASH Eating Plan DASH stands for "Dietary Approaches to Stop Hypertension." The DASH eating plan is a healthy eating plan that has been shown to reduce high blood pressure (hypertension). It may also reduce your risk for type 2 diabetes, heart disease, and stroke. The DASH eating plan may also help with weight loss. What are tips for following this plan?  General guidelines  Avoid eating more than 2,300 mg (milligrams) of salt (sodium) a day. If you have hypertension, you may need to reduce your sodium intake to 1,500 mg a day.  Limit alcohol intake to no more than 1 drink a day for nonpregnant women and 2 drinks a day for men. One drink equals 12 oz of beer, 5 oz of wine, or 1 oz of hard liquor.  Work with your health care provider to maintain a healthy body weight or to lose weight. Ask what an ideal weight is for you.  Get at least 30 minutes of exercise that causes your heart to beat faster (aerobic exercise) most days of the week. Activities may include walking, swimming, or biking.  Work with your health care provider or diet and nutrition specialist (dietitian) to adjust your eating plan to your individual calorie needs. Reading food labels   Check food labels for the amount of sodium per serving. Choose foods with less than 5 percent of the Daily Value of sodium. Generally, foods with less than 300 mg of sodium per serving fit into this eating plan.  To find whole grains, look for the word "whole" as the first word in the ingredient list. Shopping  Buy products labeled as "low-sodium" or "no salt  added."  Buy fresh foods. Avoid canned foods and premade or frozen meals. Cooking  Avoid adding salt when cooking. Use salt-free seasonings or herbs instead of table salt or sea salt. Check with your health care provider or pharmacist before using salt substitutes.  Do not fry foods. Cook foods using healthy methods such as baking, boiling, grilling, and broiling instead.  Cook with heart-healthy oils, such as olive, canola, soybean, or sunflower oil. Meal planning  Eat a balanced diet that includes: ? 5 or more servings of fruits and vegetables each day. At each meal, try to fill half of your plate with fruits and vegetables. ? Up to 6-8 servings of whole grains each day. ? Less than 6 oz of lean meat, poultry, or fish each day. A 3-oz serving of meat is about the same size as a deck of cards. One egg equals 1 oz. ? 2 servings of low-fat dairy each day. ? A serving of nuts, seeds, or beans 5 times each week. ? Heart-healthy fats. Healthy fats called Omega-3 fatty acids are found in foods such as flaxseeds and coldwater fish, like sardines, salmon, and mackerel.  Limit how much you eat of the following: ? Canned or prepackaged foods. ? Food that is high in trans fat, such as fried foods. ? Food that is high in saturated fat, such as fatty meat. ? Sweets, desserts, sugary drinks, and other foods with added sugar. ? Full-fat  dairy products.  Do not salt foods before eating.  Try to eat at least 2 vegetarian meals each week.  Eat more home-cooked food and less restaurant, buffet, and fast food.  When eating at a restaurant, ask that your food be prepared with less salt or no salt, if possible. What foods are recommended? The items listed may not be a complete list. Talk with your dietitian about what dietary choices are best for you. Grains Whole-grain or whole-wheat bread. Whole-grain or whole-wheat pasta. Brown rice. [place]. Bulgur. Whole-grain and low-sodium cereals.  Pita bread. Low-fat, low-sodium crackers. Whole-wheat flour tortillas. Vegetables Fresh or frozen vegetables (raw, steamed, roasted, or grilled). Low-sodium or reduced-sodium tomato and vegetable juice. Low-sodium or reduced-sodium tomato sauce and tomato paste. Low-sodium or reduced-sodium canned vegetables. Fruits All fresh, dried, or frozen fruit. Canned fruit in natural juice (without added sugar). Meat and other protein foods Skinless chicken or [place]. Ground chicken or [place]. Pork with fat trimmed off. Fish and seafood. Egg whites. Dried beans, peas, or lentils. Unsalted nuts, nut butters, and seeds. Unsalted canned beans. Lean cuts of beef with fat trimmed off. Low-sodium, lean deli meat. Dairy Low-fat (1%) or fat-free (skim) milk. Fat-free, low-fat, or reduced-fat cheeses. Nonfat, low-sodium ricotta or cottage cheese. Low-fat or nonfat yogurt. Low-fat, low-sodium cheese. Fats and oils Soft margarine without trans fats. Vegetable oil. Low-fat, reduced-fat, or light mayonnaise and salad dressings (reduced-sodium). Canola, safflower, olive, soybean, and sunflower oils. Avocado. Seasoning and other foods Herbs. Spices. Seasoning mixes without salt. Unsalted popcorn and pretzels. Fat-free sweets. What foods are not recommended? The items listed may not be a complete list. Talk with your dietitian about what dietary choices are best for you. Grains Baked goods made with fat, such as croissants, muffins, or some breads. Dry pasta or rice meal packs. Vegetables Creamed or fried vegetables. Vegetables in a cheese sauce. Regular canned vegetables (not low-sodium or reduced-sodium). Regular canned tomato sauce and paste (not low-sodium or reduced-sodium). Regular tomato and vegetable juice (not low-sodium or reduced-sodium). Angie Fava. Olives. Fruits Canned fruit in a light or heavy syrup. Fried fruit. Fruit in cream or butter sauce. Meat and other protein foods Fatty cuts of meat. Ribs. Fried  meat. Berniece Salines. Sausage. Bologna and other processed lunch meats. Salami. Fatback. Hotdogs. Bratwurst. Salted nuts and seeds. Canned beans with added salt. Canned or smoked fish. Whole eggs or egg yolks. Chicken or [place] with skin. Dairy Whole or 2% milk, cream, and half-and-half. Whole or full-fat cream cheese. Whole-fat or sweetened yogurt. Full-fat cheese. Nondairy creamers. Whipped toppings. Processed cheese and cheese spreads. Fats and oils Butter. Stick margarine. Lard. Shortening. Ghee. Bacon fat. Tropical oils, such as coconut, palm kernel, or palm oil. Seasoning and other foods Salted popcorn and pretzels. Onion salt, garlic salt, seasoned salt, table salt, and sea salt. Worcestershire sauce. Tartar sauce. Barbecue sauce. Teriyaki sauce. Soy sauce, including reduced-sodium. Steak sauce. Canned and packaged gravies. Fish sauce. Oyster sauce. Cocktail sauce. Horseradish that you find on the shelf. Ketchup. Mustard. Meat flavorings and tenderizers. Bouillon cubes. Hot sauce and Tabasco sauce. Premade or packaged marinades. Premade or packaged taco seasonings. Relishes. Regular salad dressings. Where to find more information:  National Heart, Lung, and [place]: https://wilson-eaton.com/  American Heart Association: www.heart.org Summary  The DASH eating plan is a healthy eating plan that has been shown to reduce high blood pressure (hypertension). It may also reduce your risk for type 2 diabetes, heart disease, and stroke.  With the DASH eating plan, you should  limit salt (sodium) intake to 2,300 mg a day. If you have hypertension, you may need to reduce your sodium intake to 1,500 mg a day.  When on the DASH eating plan, aim to eat more fresh fruits and vegetables, whole grains, lean proteins, low-fat dairy, and heart-healthy fats.  Work with your health care provider or diet and nutrition specialist (dietitian) to adjust your eating plan to your individual calorie needs. This information is  not intended to replace advice given to you by your health care provider. Make sure you discuss any questions you have with your health care provider. Document Revised: 08/11/2017 Document Reviewed: 08/22/2016 Elsevier Patient Education  2020 [employer].

## 2019-11-16 LAB — CBC WITH DIFFERENTIAL/PLATELET
Absolute Monocytes: 760 cells/uL (ref 200–950)
Basophils Absolute: 50 cells/uL (ref 0–200)
Basophils Relative: 0.5 %
Eosinophils Absolute: 570 cells/uL — ABNORMAL HIGH (ref 15–500)
Eosinophils Relative: 5.7 %
HCT: 41.5 % (ref 35.0–45.0)
Hemoglobin: 13.7 g/dL (ref 11.7–15.5)
Lymphs Abs: 3110 cells/uL (ref 850–3900)
MCH: 29.5 pg (ref 27.0–33.0)
MCHC: 33 g/dL (ref 32.0–36.0)
MCV: 89.2 fL (ref 80.0–100.0)
MPV: 10.3 fL (ref 7.5–12.5)
Monocytes Relative: 7.6 %
Neutro Abs: 5510 cells/uL (ref 1500–7800)
Neutrophils Relative %: 55.1 %
Platelets: 290 10*3/uL (ref 140–400)
RBC: 4.65 10*6/uL (ref 3.80–5.10)
RDW: 13.2 % (ref 11.0–15.0)
Total Lymphocyte: 31.1 %
WBC: 10 10*3/uL (ref 3.8–10.8)

## 2019-11-16 LAB — HEMOGLOBIN A1C
Hgb A1c MFr Bld: 6.2 % of total Hgb — ABNORMAL HIGH (ref <5.7)
Mean Plasma Glucose: 131 (calc)
eAG (mmol/L): 7.3 (calc)

## 2019-11-16 LAB — BASIC METABOLIC PANEL
BUN: 9 mg/dL (ref 7–25)
CO2: 25 mmol/L (ref 20–32)
Calcium: 9.2 mg/dL (ref 8.6–10.4)
Chloride: 106 mmol/L (ref 98–110)
Creat: 0.62 mg/dL (ref 0.50–1.05)
Glucose, Bld: 76 mg/dL (ref 65–99)
Potassium: 4.2 mmol/L (ref 3.5–5.3)
Sodium: 139 mmol/L (ref 135–146)

## 2019-12-09 ENCOUNTER — Other Ambulatory Visit: Payer: Self-pay | Admitting: Physician Assistant

## 2019-12-09 DIAGNOSIS — R062 Wheezing: Secondary | ICD-10-CM

## 2019-12-25 ENCOUNTER — Ambulatory Visit (INDEPENDENT_AMBULATORY_CARE_PROVIDER_SITE_OTHER): Payer: Managed Care, Other (non HMO) | Admitting: Physician Assistant

## 2019-12-25 ENCOUNTER — Other Ambulatory Visit: Payer: Self-pay

## 2019-12-25 ENCOUNTER — Encounter: Payer: Self-pay | Admitting: Physician Assistant

## 2019-12-25 ENCOUNTER — Encounter: Payer: Self-pay | Admitting: Emergency Medicine

## 2019-12-25 VITALS — BP 118/78 | HR 96 | Temp 98.4°F | Resp 16 | Ht 62.0 in | Wt 158.0 lb

## 2019-12-25 DIAGNOSIS — Z1211 Encounter for screening for malignant neoplasm of colon: Secondary | ICD-10-CM

## 2019-12-25 DIAGNOSIS — I10 Essential (primary) hypertension: Secondary | ICD-10-CM

## 2019-12-25 LAB — BASIC METABOLIC PANEL
BUN: 9 mg/dL (ref 6–23)
CO2: 30 mEq/L (ref 19–32)
Calcium: 9.2 mg/dL (ref 8.4–10.5)
Chloride: 101 mEq/L (ref 96–112)
Creatinine, Ser: 0.63 mg/dL (ref 0.40–1.20)
GFR: 120.31 mL/min (ref >60.00)
Glucose, Bld: 92 mg/dL (ref 70–99)
Potassium: 4.3 mEq/L (ref 3.5–5.1)
Sodium: 137 mEq/L (ref 135–145)

## 2019-12-25 MED ORDER — PHENTERMINE HCL 30 MG PO CAPS: 30.0000 mg | ORAL_CAPSULE | ORAL | 0 refills | Status: DC

## 2019-12-25 MED ORDER — POTASSIUM CHLORIDE CRYS ER 20 MEQ PO TBCR: 20.0000 meq | EXTENDED_RELEASE_TABLET | Freq: Every day | ORAL | 0 refills | Status: DC

## 2019-12-25 NOTE — Patient Instructions (Signed)
Please go to the lab today for blood work.  I will call you with your results. We will alter treatment regimen(s) if indicated by your results.   Please continue medication (HCTZ) daily as directed. I am going ahead and starting you on a low dose potassium supplement daily. We may adjust dose based on lab findings.   Keep up with your diet and exercise. Remember weekly exercise goal is 150 minutes.  Take the phentermine as directed. Follow-up with me via video visit or phone to let me know how things are going and we can determine if we should continue medication or now.   DASH Eating Plan DASH stands for "Dietary Approaches to Stop Hypertension." The DASH eating plan is a healthy eating plan that has been shown to reduce high blood pressure (hypertension). It may also reduce your risk for type 2 diabetes, heart disease, and stroke. The DASH eating plan may also help with weight loss. What are tips for following this plan?  General guidelines  Avoid eating more than 2,300 mg (milligrams) of salt (sodium) a day. If you have hypertension, you may need to reduce your sodium intake to 1,500 mg a day.  Limit alcohol intake to no more than 1 drink a day for nonpregnant women and 2 drinks a day for men. One drink equals 12 oz of beer, 5 oz of wine, or 1 oz of hard liquor.  Work with your health care provider to maintain a healthy body weight or to lose weight. Ask what an ideal weight is for you.  Get at least 30 minutes of exercise that causes your heart to beat faster (aerobic exercise) most days of the week. Activities may include walking, swimming, or biking.  Work with your health care provider or diet and nutrition specialist (dietitian) to adjust your eating plan to your individual calorie needs. Reading food labels   Check food labels for the amount of sodium per serving. Choose foods with less than 5 percent of the Daily Value of sodium. Generally, foods with less than 300 mg of  sodium per serving fit into this eating plan.  To find whole grains, look for the word "whole" as the first word in the ingredient list. Shopping  Buy products labeled as "low-sodium" or "no salt added."  Buy fresh foods. Avoid canned foods and premade or frozen meals. Cooking  Avoid adding salt when cooking. Use salt-free seasonings or herbs instead of table salt or sea salt. Check with your health care provider or pharmacist before using salt substitutes.  Do not fry foods. Cook foods using healthy methods such as baking, boiling, grilling, and broiling instead.  Cook with heart-healthy oils, such as olive, canola, soybean, or sunflower oil. Meal planning  Eat a balanced diet that includes: ? 5 or more servings of fruits and vegetables each day. At each meal, try to fill half of your plate with fruits and vegetables. ? Up to 6-8 servings of whole grains each day. ? Less than 6 oz of lean meat, poultry, or fish each day. A 3-oz serving of meat is about the same size as a deck of cards. One egg equals 1 oz. ? 2 servings of low-fat dairy each day. ? A serving of nuts, seeds, or beans 5 times each week. ? Heart-healthy fats. Healthy fats called Omega-3 fatty acids are found in foods such as flaxseeds and coldwater fish, like sardines, salmon, and mackerel.  Limit how much you eat of the following: ? Canned or  prepackaged foods. ? Food that is high in trans fat, such as fried foods. ? Food that is high in saturated fat, such as fatty meat. ? Sweets, desserts, sugary drinks, and other foods with added sugar. ? Full-fat dairy products.  Do not salt foods before eating.  Try to eat at least 2 vegetarian meals each week.  Eat more home-cooked food and less restaurant, buffet, and fast food.  When eating at a restaurant, ask that your food be prepared with less salt or no salt, if possible. What foods are recommended? The items listed may not be a complete list. Talk with your  dietitian about what dietary choices are best for you. Grains Whole-grain or whole-wheat bread. Whole-grain or whole-wheat pasta. Brown rice. [place]. Bulgur. Whole-grain and low-sodium cereals. Pita bread. Low-fat, low-sodium crackers. Whole-wheat flour tortillas. Vegetables Fresh or frozen vegetables (raw, steamed, roasted, or grilled). Low-sodium or reduced-sodium tomato and vegetable juice. Low-sodium or reduced-sodium tomato sauce and tomato paste. Low-sodium or reduced-sodium canned vegetables. Fruits All fresh, dried, or frozen fruit. Canned fruit in natural juice (without added sugar). Meat and other protein foods Skinless chicken or [place]. Ground chicken or [place]. Pork with fat trimmed off. Fish and seafood. Egg whites. Dried beans, peas, or lentils. Unsalted nuts, nut butters, and seeds. Unsalted canned beans. Lean cuts of beef with fat trimmed off. Low-sodium, lean deli meat. Dairy Low-fat (1%) or fat-free (skim) milk. Fat-free, low-fat, or reduced-fat cheeses. Nonfat, low-sodium ricotta or cottage cheese. Low-fat or nonfat yogurt. Low-fat, low-sodium cheese. Fats and oils Soft margarine without trans fats. Vegetable oil. Low-fat, reduced-fat, or light mayonnaise and salad dressings (reduced-sodium). Canola, safflower, olive, soybean, and sunflower oils. Avocado. Seasoning and other foods Herbs. Spices. Seasoning mixes without salt. Unsalted popcorn and pretzels. Fat-free sweets. What foods are not recommended? The items listed may not be a complete list. Talk with your dietitian about what dietary choices are best for you. Grains Baked goods made with fat, such as croissants, muffins, or some breads. Dry pasta or rice meal packs. Vegetables Creamed or fried vegetables. Vegetables in a cheese sauce. Regular canned vegetables (not low-sodium or reduced-sodium). Regular canned tomato sauce and paste (not low-sodium or reduced-sodium). Regular tomato and vegetable juice (not  low-sodium or reduced-sodium). Angie Fava. Olives. Fruits Canned fruit in a light or heavy syrup. Fried fruit. Fruit in cream or butter sauce. Meat and other protein foods Fatty cuts of meat. Ribs. Fried meat. Berniece Salines. Sausage. Bologna and other processed lunch meats. Salami. Fatback. Hotdogs. Bratwurst. Salted nuts and seeds. Canned beans with added salt. Canned or smoked fish. Whole eggs or egg yolks. Chicken or [place] with skin. Dairy Whole or 2% milk, cream, and half-and-half. Whole or full-fat cream cheese. Whole-fat or sweetened yogurt. Full-fat cheese. Nondairy creamers. Whipped toppings. Processed cheese and cheese spreads. Fats and oils Butter. Stick margarine. Lard. Shortening. Ghee. Bacon fat. Tropical oils, such as coconut, palm kernel, or palm oil. Seasoning and other foods Salted popcorn and pretzels. Onion salt, garlic salt, seasoned salt, table salt, and sea salt. Worcestershire sauce. Tartar sauce. Barbecue sauce. Teriyaki sauce. Soy sauce, including reduced-sodium. Steak sauce. Canned and packaged gravies. Fish sauce. Oyster sauce. Cocktail sauce. Horseradish that you find on the shelf. Ketchup. Mustard. Meat flavorings and tenderizers. Bouillon cubes. Hot sauce and Tabasco sauce. Premade or packaged marinades. Premade or packaged taco seasonings. Relishes. Regular salad dressings. Where to find more information:  National Heart, Lung, and [place]: https://wilson-eaton.com/  American Heart Association: www.heart.org Summary  The DASH  eating plan is a healthy eating plan that has been shown to reduce high blood pressure (hypertension). It may also reduce your risk for type 2 diabetes, heart disease, and stroke.  With the DASH eating plan, you should limit salt (sodium) intake to 2,300 mg a day. If you have hypertension, you may need to reduce your sodium intake to 1,500 mg a day.  When on the DASH eating plan, aim to eat more fresh fruits and vegetables, whole grains, lean proteins,  low-fat dairy, and heart-healthy fats.  Work with your health care provider or diet and nutrition specialist (dietitian) to adjust your eating plan to your individual calorie needs. This information is not intended to replace advice given to you by your health care provider. Make sure you discuss any questions you have with your health care provider. Document Revised: 08/11/2017 Document Reviewed: 08/22/2016 Elsevier Patient Education  2020 [employer].

## 2019-12-25 NOTE — Progress Notes (Signed)
Patient presents to clinic today for follow-up of hypertension. Patient started on HCTZ 12.5 mg daily at last visit. Endorses taking medication as directed. Tolerating well overall. Noting improvement with leg swelling. Is following a low sodium diet. Patient denies chest pain, palpitations, lightheadedness, dizziness, vision changes or frequent headaches. Has noted some mild cramping of feet since starting medication.  Health Maintenance: - PAP up-to-date. Records requested. - Colonoscopy due. Was scheduled last year but canceled due to [place]. Average risk, asymptomatic.  Past Medical History:  Diagnosis Date  . Asthma   . Diabetes mellitus without complication ([place])   . History of chickenpox   . History of prediabetes   . Hyperlipidemia     Current Outpatient Medications on File Prior to Visit  Medication Sig Dispense Refill  . albuterol (VENTOLIN HFA) 108 (90 Base) MCG/ACT inhaler TAKE 2 PUFFS BY MOUTH EVERY 6 HOURS AS NEEDED FOR WHEEZE OR SHORTNESS OF BREATH 18 g 3  . fluticasone (FLOVENT HFA) 220 MCG/ACT inhaler Inhale 1 puff into the lungs 2 (two) times daily. 1 Inhaler 12  . hydrochlorothiazide (MICROZIDE) 12.5 MG capsule Take 1 capsule (12.5 mg total) by mouth daily. 30 capsule 2  . naftifine (NAFTIN) 1 % cream Apply topically daily. 30 g 0  . pravastatin (PRAVACHOL) 80 MG tablet Take 1 tablet (80 mg total) by mouth daily. 90 tablet 0   No current facility-administered medications on file prior to visit.    Allergies  Allergen Reactions  . Penicillins Rash    Family History  Problem Relation Age of Onset  . Asthma Mother   . Stroke Mother   . Kidney disease Father   . Alcohol abuse Father   . Hypertension Sister   . Diabetes Brother   . Colon cancer Neg Hx   . Esophageal cancer Neg Hx   . Rectal cancer Neg Hx   . Stomach cancer Neg Hx     Social History   Socioeconomic History  . Marital status: Married    Spouse name: Not on file  . Number of children:  Not on file  . Years of education: Not on file  . Highest education level: Not on file  Occupational History  . Occupation: [employer]  . Smoking status: Current Every Day Smoker    Packs/day: 0.25    Types: Cigarettes  . Smokeless tobacco: Never Used  . Tobacco comment: quit February 2020  Substance and Sexual Activity  . Alcohol use: No  . Drug use: No  . Sexual activity: Yes    Birth control/protection: Surgical  Other Topics Concern  . Not on file  Social History Narrative  . Not on file   Social Determinants of Health   Financial Resource Strain:   . Difficulty of Paying Living Expenses:   Food Insecurity:   . Worried About Charity fundraiser in the Last Year:   . Arboriculturist in the Last Year:   Transportation Needs:   . Film/video editor (Medical):   Marland Kitchen Lack of Transportation (Non-Medical):   Physical Activity:   . Days of Exercise per Week:   . Minutes of Exercise per Session:   Stress:   . Feeling of Stress :   Social Connections:   . Frequency of Communication with Friends and Family:   . Frequency of Social Gatherings with Friends and Family:   . Attends Religious Services:   . Active Member of Clubs or Organizations:   .  Attends Banker Meetings:   Marland Kitchen Marital Status:    Review of Systems - See HPI.  All other ROS are negative.  BP 118/78   Pulse 96   Temp 98.4 F (36.9 C) (Temporal)   Resp 16   Ht 5\' 2"  (1.575 m)   Wt 158 lb (71.7 kg)   SpO2 98%   BMI 28.90 kg/m   Physical Exam Vitals reviewed.  Constitutional:      Appearance: Normal appearance.  HENT:     Head: Normocephalic and atraumatic.     Mouth/Throat:     Mouth: Mucous membranes are moist.  Eyes:     Conjunctiva/sclera: Conjunctivae normal.     Pupils: Pupils are equal, round, and reactive to light.  Cardiovascular:     Rate and Rhythm: Normal rate and regular rhythm.     Pulses: Normal pulses.          Popliteal pulses are 2+ on the  right side and 2+ on the left side.       Dorsalis pedis pulses are 2+ on the right side and 2+ on the left side.       Posterior tibial pulses are 2+ on the right side and 2+ on the left side.  Pulmonary:     Effort: Pulmonary effort is normal.     Breath sounds: Normal breath sounds.  Musculoskeletal:     Cervical back: Neck supple.     Right lower leg: No edema.     Left lower leg: No edema.  Neurological:     General: No focal deficit present.     Mental Status: She is alert and oriented to person, place, and time.  Psychiatric:        Mood and Affect: Mood normal.    Recent Results (from the past 2160 hour(s))  POCT Glucose (CBG)     Status: Normal   Collection Time: 11/15/19  2:11 PM  Result Value Ref Range   POC Glucose 91 70 - 99 mg/dl  Basic metabolic panel     Status: None   Collection Time: 11/15/19  2:25 PM  Result Value Ref Range   Glucose, Bld 76 65 - 99 mg/dL    Comment: .            Fasting reference interval .    BUN 9 7 - 25 mg/dL   Creat 01/15/20 9.17 - 9.15 mg/dL    Comment: For patients >53 years of age, the reference limit for Creatinine is approximately 13% higher for people identified as African-American. .    BUN/Creatinine Ratio NOT APPLICABLE 6 - 22 (calc)   Sodium 139 135 - 146 mmol/L   Potassium 4.2 3.5 - 5.3 mmol/L   Chloride 106 98 - 110 mmol/L   CO2 25 20 - 32 mmol/L   Calcium 9.2 8.6 - 10.4 mg/dL  CBC w/Diff     Status: Abnormal   Collection Time: 11/15/19  2:25 PM  Result Value Ref Range   WBC 10.0 3.8 - 10.8 Thousand/uL   RBC 4.65 3.80 - 5.10 Million/uL   Hemoglobin 13.7 11.7 - 15.5 g/dL   HCT 01/15/20 97.9 - 48.0 %   MCV 89.2 80.0 - 100.0 fL   MCH 29.5 27.0 - 33.0 pg   MCHC 33.0 32.0 - 36.0 g/dL   RDW 16.5 53.7 - 48.2 %   Platelets 290 140 - 400 Thousand/uL   MPV 10.3 7.5 - 12.5 fL   Neutro Abs 5,510 1,500 -  7,800 cells/uL   Lymphs Abs 3,110 850 - 3,900 cells/uL   Absolute Monocytes 760 200 - 950 cells/uL   Eosinophils Absolute  570 (H) 15 - 500 cells/uL   Basophils Absolute 50 0 - 200 cells/uL   Neutrophils Relative % 55.1 %   Total Lymphocyte 31.1 %   Monocytes Relative 7.6 %   Eosinophils Relative 5.7 %   Basophils Relative 0.5 %  Hemoglobin A1c     Status: Abnormal   Collection Time: 11/15/19  2:25 PM  Result Value Ref Range   Hgb A1c MFr Bld 6.2 (H) <5.7 % of total Hgb    Comment: For someone without known diabetes, a hemoglobin  A1c value between 5.7% and 6.4% is consistent with prediabetes and should be confirmed with a  follow-up test. . For someone with known diabetes, a value <7% indicates that their diabetes is well controlled. A1c targets should be individualized based on duration of diabetes, age, comorbid conditions, and other considerations. . This assay result is consistent with an increased risk of diabetes. . Currently, no consensus exists regarding use of hemoglobin A1c for diagnosis of diabetes for children. .    Mean Plasma Glucose 131 (calc)   eAG (mmol/L) 7.3 (calc)   Assessment/Plan: 1. Essential hypertension BP normotensive. Recheck BMP today. Giving cramping will add on 20 mEq of Klor Con daily. Will adjust based on results of BMP. Continue DASH diet. Continue current regiment.  - Basic metabolic panel  2. Colon cancer screening Average risk. Asymptomatic. Previous referral expired. New referral to GI for screening colonoscopy placed today. - Ambulatory referral to Gastroenterology   Piedad Climes, PA-C

## 2020-01-12 ENCOUNTER — Other Ambulatory Visit: Payer: Self-pay | Admitting: Physician Assistant

## 2020-01-12 DIAGNOSIS — E785 Hyperlipidemia, unspecified: Secondary | ICD-10-CM

## 2020-01-12 DIAGNOSIS — E1169 Type 2 diabetes mellitus with other specified complication: Secondary | ICD-10-CM

## 2020-01-19 ENCOUNTER — Other Ambulatory Visit: Payer: Self-pay | Admitting: Physician Assistant

## 2020-02-06 ENCOUNTER — Other Ambulatory Visit: Payer: Self-pay | Admitting: Physician Assistant

## 2020-02-24 ENCOUNTER — Other Ambulatory Visit: Payer: Self-pay | Admitting: Physician Assistant

## 2020-02-24 DIAGNOSIS — Z1231 Encounter for screening mammogram for malignant neoplasm of breast: Secondary | ICD-10-CM

## 2020-03-03 ENCOUNTER — Other Ambulatory Visit: Payer: Self-pay

## 2020-03-03 ENCOUNTER — Ambulatory Visit
Admission: RE | Admit: 2020-03-03 | Discharge: 2020-03-03 | Disposition: A | Discharge status: Home / Self Care | Payer: Managed Care, Other (non HMO) | Source: Ambulatory Visit | Attending: Physician Assistant | Admitting: Physician Assistant

## 2020-03-03 DIAGNOSIS — Z1231 Encounter for screening mammogram for malignant neoplasm of breast: Secondary | ICD-10-CM

## 2020-04-13 ENCOUNTER — Other Ambulatory Visit: Payer: Self-pay | Admitting: Physician Assistant

## 2020-04-13 DIAGNOSIS — E1169 Type 2 diabetes mellitus with other specified complication: Secondary | ICD-10-CM

## 2020-04-21 ENCOUNTER — Telehealth: Payer: Self-pay | Admitting: Physician Assistant

## 2020-04-21 NOTE — Telephone Encounter (Signed)
Called patient and patient said that she was going to hold off on a visit because she is trying to get a covid test.

## 2020-04-21 NOTE — Telephone Encounter (Signed)
Patient will need a video visit for her symptoms per [facility]

## 2020-04-21 NOTE — Telephone Encounter (Signed)
Please call patient - she is having sinus drainage, pain under her eye and a cough.  She had to leave work because of this.  Please advise if she can be seen virtually here or if she needs to be seen somewhere else.

## 2020-04-24 ENCOUNTER — Other Ambulatory Visit: Payer: Self-pay

## 2020-04-24 ENCOUNTER — Encounter: Payer: Self-pay | Admitting: Physician Assistant

## 2020-04-24 ENCOUNTER — Telehealth: Payer: Self-pay | Admitting: *Deleted

## 2020-04-24 ENCOUNTER — Telehealth (INDEPENDENT_AMBULATORY_CARE_PROVIDER_SITE_OTHER): Payer: Managed Care, Other (non HMO) | Admitting: Physician Assistant

## 2020-04-24 VITALS — Temp 100.5°F

## 2020-04-24 DIAGNOSIS — U071 COVID-19: Secondary | ICD-10-CM | POA: Diagnosis not present

## 2020-04-24 MED ORDER — BENZONATATE 100 MG PO CAPS: 100.0000 mg | ORAL_CAPSULE | Freq: Three times a day (TID) | ORAL | 0 refills | Status: DC | PRN

## 2020-04-24 MED ORDER — FLOVENT HFA 220 MCG/ACT IN AERO: 1.0000 | INHALATION_SPRAY | Freq: Two times a day (BID) | RESPIRATORY_TRACT | 0 refills | Status: DC

## 2020-04-24 MED ORDER — ALBUTEROL SULFATE HFA 108 (90 BASE) MCG/ACT IN AERS: INHALATION_SPRAY | RESPIRATORY_TRACT | 3 refills | Status: DC

## 2020-04-24 NOTE — Telephone Encounter (Signed)
Call to patient - discussed fatigue associated with COVID- fever(101.0) and inflammation treatments. Patient did have visit with PCP today and was prescribed medications that she will begin taking. Advised rest, hydration, good diet and follow prescribed treatment for symptoms. Patient to continue her questionnaire and will keep PCP abreast of any changes.

## 2020-04-24 NOTE — Progress Notes (Signed)
Virtual Visit via Video   I connected with patient on 04/24/20 at 11:30 AM EDT by a video enabled telemedicine application and verified that I am speaking with the correct person using two identifiers.  Location patient: Home Location provider: [place] [facility], Office Persons participating in the virtual visit: Patient, Provider, CMA (Patina Moore)  I discussed the limitations of evaluation and management by telemedicine and the availability of in person appointments. The patient expressed understanding and agreed to proceed.  Subjective:   HPI:   Patient presents via Care[facility] today after testing positive for COVID.Marland Kitchen Patient endorses her symptoms started Tuesday, initially noting nasal drainage with a dry cough and slight fatigue.  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  That she had a very hard time getting a Covid test scheduled.  Had to drive all the way to [facility] in [place].  Was tested around 3 PM on Wednesday and getting her positive results yesterday.  Over the past 48 hours she is noted more significant fatigue, worsening more in the past hours so today.  Notes fever with T-max of 102.6.  Has been controlled with OTC Tylenol and ibuprofen.  Current fever of 100.6.  Patient has noted some episodes of chest tightness without overt shortness of breath.  Denies any chest pain but has noted some chest wall tenderness during coughing episodes.  Denies any productive cough.  Denies ear pain, facial pain or tooth pain.  Is starting to notice some loss of taste.  No loss of smell.  Denies GI symptoms.  Unsure of of when she was exposed.  Has been taking Chestal, Cold Calm, Nyquil last night.  Does not have her inhalers currently. ROS:   See pertinent positives and negatives per HPI.  Patient Active Problem List   Diagnosis Date Noted  . Cigarette nicotine dependence without complication 08/22/2018  . Visit for preventive health examination  11/29/2017  . Diabetes mellitus (HCC) 11/29/2017  . Tinea pedis of both feet 11/29/2017  . Chronic tension-type headache, not intractable 11/29/2017  . Elevated BP without diagnosis of hypertension 11/29/2017    Social History   Tobacco Use  . Smoking status: Current Every Day Smoker    Packs/day: 0.25    Types: Cigarettes  . Smokeless tobacco: Never Used  . Tobacco comment: quit February 2020  Substance Use Topics  . Alcohol use: No    Current Outpatient Medications:  .  hydrochlorothiazide (MICROZIDE) 12.5 MG capsule, TAKE 1 CAPSULE BY MOUTH EVERY DAY, Disp: 90 capsule, Rfl: 1 .  KLOR-CON M20 20 MEQ tablet, TAKE 1 TABLET BY MOUTH EVERY DAY, Disp: 90 tablet, Rfl: 1 .  naftifine (NAFTIN) 1 % cream, Apply topically daily., Disp: 30 g, Rfl: 0 .  pravastatin (PRAVACHOL) 80 MG tablet, TAKE 1 TABLET BY MOUTH EVERY DAY, Disp: 90 tablet, Rfl: 1 .  albuterol (VENTOLIN HFA) 108 (90 Base) MCG/ACT inhaler, TAKE 2 PUFFS BY MOUTH EVERY 6 HOURS AS NEEDED FOR WHEEZE OR SHORTNESS OF BREATH (Patient not taking: Reported on 04/24/2020), Disp: 18 g, Rfl: 3 .  fluticasone (FLOVENT HFA) 220 MCG/ACT inhaler, Inhale 1 puff into the lungs 2 (two) times daily. (Patient not taking: Reported on 04/24/2020), Disp: 1 Inhaler, Rfl: 12  Allergies  Allergen Reactions  . Penicillins Rash    Objective:   Temp (!) 100.5 F (38.1 C) (Oral)   Patient is well-developed, well-nourished in no acute distress.  Resting comfortably at home.  Head is normocephalic, atraumatic.  No labored breathing.  Speech is clear and coherent with logical content.  Patient is alert and oriented at baseline.   Assessment and Plan:   1. COVID-19 virus infection Mild to moderate symptoms.  No increased work of breathing.  Fever is responsive to over-the-counter antipyretics.  Patient enrolled in MyChart Covid monitoring program.  She is to start OTC vitamin D3 1000 units daily, vitamin C 1000 mg daily and an over-the-counter zinc  supplement.  Other OTC medications and supportive measures reviewed with patient.  Inhalers refilled.  Strict ER precautions reviewed with patient.  Quarantine instructions given. Piedad Climes, PA-C 04/24/2020

## 2020-04-24 NOTE — Patient Instructions (Signed)
Instructions sent to MyChart

## 2020-04-24 NOTE — Progress Notes (Signed)
I have discussed the procedure for the virtual visit with the patient who has given consent to proceed with assessment and treatment.   Pt unable to obtain vitals.   Damauri Minion L Lamond Glantz, CMA     

## 2020-04-26 ENCOUNTER — Telehealth: Payer: Self-pay

## 2020-04-26 NOTE — Telephone Encounter (Signed)
Fever 100.5  OTC Tylenol and cough medicine- feels hot. Stated mucous breaking up in chest. Color of phlegm is clear. Taking tessalon Perles, Nyquil with honey. Nothing with DM in meds. Pt stated that she has decreased nasal congestion. Advised pt to drink plenty of extra fluids, cough drops, humidifier and warm fluids. Advised pt to call PCP if coughing up yellow or green phlegm or worsening respiratory sx. Advise pt if cough is worse even with OTC medications or if cough is keeping her up at night to call PCP. Advised pt to take 2 teaspoons of honey to try and suppress cough at night. For fever: advised treating if febrile if > 101. Advised that fever helps fight her infection. Advised pt not to over bundle with blankets or extra layers of clothing wen febrile to prevent overheating. Advised to call PCP for fever > 3 days or if fever to 103 after attempting to take antipyretics. Pt verbalized understanding of instructions. Gave pt call back number for any further issues.

## 2020-05-03 ENCOUNTER — Encounter (INDEPENDENT_AMBULATORY_CARE_PROVIDER_SITE_OTHER): Payer: Self-pay

## 2020-05-03 ENCOUNTER — Other Ambulatory Visit: Payer: Self-pay | Admitting: Physician Assistant

## 2020-06-15 ENCOUNTER — Other Ambulatory Visit: Payer: Self-pay | Admitting: Physician Assistant

## 2020-06-15 DIAGNOSIS — U071 COVID-19: Secondary | ICD-10-CM

## 2020-07-12 ENCOUNTER — Other Ambulatory Visit: Payer: Self-pay | Admitting: Physician Assistant

## 2020-07-17 ENCOUNTER — Other Ambulatory Visit: Payer: Self-pay

## 2020-07-17 ENCOUNTER — Ambulatory Visit (INDEPENDENT_AMBULATORY_CARE_PROVIDER_SITE_OTHER): Payer: Managed Care, Other (non HMO) | Admitting: Physician Assistant

## 2020-07-17 ENCOUNTER — Encounter: Payer: Self-pay | Admitting: Physician Assistant

## 2020-07-17 VITALS — BP 120/80 | HR 85 | Temp 98.4°F | Resp 16 | Ht 62.0 in | Wt 160.0 lb

## 2020-07-17 DIAGNOSIS — E785 Hyperlipidemia, unspecified: Secondary | ICD-10-CM

## 2020-07-17 DIAGNOSIS — E1169 Type 2 diabetes mellitus with other specified complication: Secondary | ICD-10-CM | POA: Diagnosis not present

## 2020-07-17 DIAGNOSIS — Z1211 Encounter for screening for malignant neoplasm of colon: Secondary | ICD-10-CM

## 2020-07-17 DIAGNOSIS — E119 Type 2 diabetes mellitus without complications: Secondary | ICD-10-CM

## 2020-07-17 DIAGNOSIS — F17210 Nicotine dependence, cigarettes, uncomplicated: Secondary | ICD-10-CM | POA: Diagnosis not present

## 2020-07-17 DIAGNOSIS — M7581 Other shoulder lesions, right shoulder: Secondary | ICD-10-CM

## 2020-07-17 DIAGNOSIS — I10 Essential (primary) hypertension: Secondary | ICD-10-CM

## 2020-07-17 DIAGNOSIS — Z008 Encounter for other general examination: Secondary | ICD-10-CM

## 2020-07-17 LAB — COMPREHENSIVE METABOLIC PANEL
ALT: 12 U/L (ref 0–35)
AST: 13 U/L (ref 0–37)
Albumin: 4.4 g/dL (ref 3.5–5.2)
Alkaline Phosphatase: 61 U/L (ref 39–117)
BUN: 9 mg/dL (ref 6–23)
CO2: 28 mEq/L (ref 19–32)
Calcium: 9.6 mg/dL (ref 8.4–10.5)
Chloride: 102 mEq/L (ref 96–112)
Creatinine, Ser: 0.65 mg/dL (ref 0.40–1.20)
GFR: 101.49 mL/min (ref >60.00)
Glucose, Bld: 89 mg/dL (ref 70–99)
Potassium: 4.4 mEq/L (ref 3.5–5.1)
Sodium: 139 mEq/L (ref 135–145)
Total Bilirubin: 0.5 mg/dL (ref 0.2–1.2)
Total Protein: 7.2 g/dL (ref 6.0–8.3)

## 2020-07-17 LAB — LIPID PANEL
Cholesterol: 159 mg/dL (ref 0–200)
HDL: 46.2 mg/dL (ref >39.00)
LDL Cholesterol: 100 mg/dL — ABNORMAL HIGH (ref 0–99)
NonHDL: 112.37
Total CHOL/HDL Ratio: 3
Triglycerides: 62 mg/dL (ref 0.0–149.0)
VLDL: 12.4 mg/dL (ref 0.0–40.0)

## 2020-07-17 LAB — MICROALBUMIN / CREATININE URINE RATIO
Creatinine,U: 244.2 mg/dL
Microalb Creat Ratio: 0.6 mg/g (ref 0.0–30.0)
Microalb, Ur: 1.5 mg/dL (ref 0.0–1.9)

## 2020-07-17 LAB — HEMOGLOBIN A1C: Hgb A1c MFr Bld: 6.5 % (ref 4.6–6.5)

## 2020-07-17 MED ORDER — MELOXICAM 15 MG PO TABS: 15.0000 mg | ORAL_TABLET | Freq: Every day | ORAL | 0 refills | Status: DC

## 2020-07-17 MED ORDER — NICOTINE POLACRILEX 2 MG MT LOZG: 2.0000 mg | LOZENGE | OROMUCOSAL | 0 refills | Status: DC | PRN

## 2020-07-17 NOTE — Patient Instructions (Signed)
Please go to the lab today for blood work.  I will call you with your results. We will alter treatment regimen(s) if indicated by your results.   Please continue chronic medications as directed.  Start the lozenges as directed to replace cigarette use. If not improvement we will need to reassess CBT to help with this or low-dose Bupropion.   For the shoulder, avoid heavy lifting and overhead lifting. Take the Meloxicam once daily with food. Apply heating pad to the area for 10-15 minutes, a few times per day.  If not improving we will need to set you up with Sports medicine.  You will be contacted by [facility] regarding colon cancer screening kit.  Complete this ASAP.   Make sure to contact your eye doctor to schedule routine follow-up.    Preventive Care 21-31 Years Old, Female Preventive care refers to visits with your health care provider and lifestyle choices that can promote health and wellness. This includes:  A yearly physical exam. This may also be called an annual well check.  Regular dental visits and eye exams.  Immunizations.  Screening for certain conditions.  Healthy lifestyle choices, such as eating a healthy diet, getting regular exercise, not using drugs or products that contain nicotine and tobacco, and limiting alcohol use. What can I expect for my preventive care visit? Physical exam Your health care provider will check your:  Height and weight. This may be used to calculate body mass index (BMI), which tells if you are at a healthy weight.  Heart rate and blood pressure.  Skin for abnormal spots. Counseling Your health care provider may ask you questions about your:  Alcohol, tobacco, and drug use.  Emotional well-being.  Home and relationship well-being.  Sexual activity.  Eating habits.  Work and work Astronomer.  Method of birth control.  Menstrual cycle.  Pregnancy history. What immunizations do I need?  Influenza (flu)  vaccine  This is recommended every year. Tetanus, diphtheria, and pertussis (Tdap) vaccine  You may need a Td booster every 10 years. Varicella (chickenpox) vaccine  You may need this if you have not been vaccinated. Zoster (shingles) vaccine  You may need this after age 59. Measles, mumps, and rubella (MMR) vaccine  You may need at least one dose of MMR if you were born in 1957 or later. You may also need a second dose. Pneumococcal conjugate (PCV13) vaccine  You may need this if you have certain conditions and were not previously vaccinated. Pneumococcal polysaccharide (PPSV23) vaccine  You may need one or two doses if you smoke cigarettes or if you have certain conditions. Meningococcal conjugate (MenACWY) vaccine  You may need this if you have certain conditions. Hepatitis A vaccine  You may need this if you have certain conditions or if you travel or work in places where you may be exposed to hepatitis A. Hepatitis B vaccine  You may need this if you have certain conditions or if you travel or work in places where you may be exposed to hepatitis B. Haemophilus influenzae type b (Hib) vaccine  You may need this if you have certain conditions. Human papillomavirus (HPV) vaccine  If recommended by your health care provider, you may need three doses over 6 months. You may receive vaccines as individual doses or as more than one vaccine together in one shot (combination vaccines). Talk with your health care provider about the risks and benefits of combination vaccines. What tests do I need? Blood tests  Lipid and  cholesterol levels. These may be checked every 5 years, or more frequently if you are over 52 years old.  Hepatitis C test.  Hepatitis B test. Screening  Lung cancer screening. You may have this screening every year starting at age 28 if you have a 30-pack-year history of smoking and currently smoke or have quit within the past 15 years.  Colorectal cancer  screening. All adults should have this screening starting at age 36 and continuing until age 16. Your health care provider may recommend screening at age 16 if you are at increased risk. You will have tests every 1-10 years, depending on your results and the type of screening test.  Diabetes screening. This is done by checking your blood sugar (glucose) after you have not eaten for a while (fasting). You may have this done every 1-3 years.  Mammogram. This may be done every 1-2 years. Talk with your health care provider about when you should start having regular mammograms. This may depend on whether you have a family history of breast cancer.  BRCA-related cancer screening. This may be done if you have a family history of breast, ovarian, tubal, or peritoneal cancers.  Pelvic exam and Pap test. This may be done every 3 years starting at age 53. Starting at age 24, this may be done every 5 years if you have a Pap test in combination with an HPV test. Other tests  Sexually transmitted disease (STD) testing.  Bone density scan. This is done to screen for osteoporosis. You may have this scan if you are at high risk for osteoporosis. Follow these instructions at home: Eating and drinking  Eat a diet that includes fresh fruits and vegetables, whole grains, lean protein, and low-fat dairy.  Take vitamin and mineral supplements as recommended by your health care provider.  Do not drink alcohol if: ? Your health care provider tells you not to drink. ? You are pregnant, may be pregnant, or are planning to become pregnant.  If you drink alcohol: ? Limit how much you have to 0-1 drink a day. ? Be aware of how much alcohol is in your drink. In the [place] one drink equals one 12 oz bottle of beer (355 mL), one 5 oz glass of wine (148 mL), or one 1 oz glass of hard liquor (44 mL). Lifestyle  Take daily care of your teeth and gums.  Stay active. Exercise for at least 30 minutes on 5 or more days  each week.  Do not use any products that contain nicotine or tobacco, such as cigarettes, e-cigarettes, and chewing tobacco. If you need help quitting, ask your health care provider.  If you are sexually active, practice safe sex. Use a condom or other form of birth control (contraception) in order to prevent pregnancy and STIs (sexually transmitted infections).  If told by your health care provider, take low-dose aspirin daily starting at age 29. What's next?  Visit your health care provider once a year for a well check visit.  Ask your health care provider how often you should have your eyes and teeth checked.  Stay up to date on all vaccines. This information is not intended to replace advice given to you by your health care provider. Make sure you discuss any questions you have with your health care provider. Document Revised: 05/10/2018 Document Reviewed: 05/10/2018 Elsevier Patient Education  2020 [employer].

## 2020-07-17 NOTE — Progress Notes (Signed)
History of Present Illness: Patient is a 54 y.o. female who presents to clinic today for follow-up of Diabetes Mellitus II, controlled.  Patient previously on medication regimen of Farxiga which was stopped at last A1C check. Patient is  taking Pravachol as directed. Endorses restarting Weight watchers, keeping a close watch on food choices and portion sizes.  Denies vision changes, numbness or tingling of hands/feets. Notes trying to stay active..  Latest Maintenance: A1C --  Lab Results  Component Value Date   HGBA1C 6.2 (H) 11/15/2019   Diabetic Eye Exam -- No history of retinopathy. Overdue for follow-up. She has agreed to schedule.  Foot Exam -- Denies concerns.   Patient would also like to readdress tobacco use and desire for cessation. Has tried many things in the past including Bupropion (mood swings), Chantix (worked well but caused hives over her body), Nicoderm (patches burn skin), CBT/Group Therapy (subtherapeutic). Denies chronic cough, SOB.  Patient also endorses pain in R shoulder for a couple of weeks, worse with range of motion. Notes harder to put clothes on or lift things above her head. Denies any noted trauma or injury. Denies swelling, bruising or redness. Has not taken anything for her symptoms.  Past Medical History:  Diagnosis Date  . Asthma   . Diabetes mellitus without complication (HCC)   . History of chickenpox   . History of prediabetes   . Hyperlipidemia     Current Outpatient Medications on File Prior to Visit  Medication Sig Dispense Refill  . albuterol (VENTOLIN HFA) 108 (90 Base) MCG/ACT inhaler TAKE 2 PUFFS BY MOUTH EVERY 6 HOURS AS NEEDED FOR WHEEZE OR SHORTNESS OF BREATH 18 g 3  . FLOVENT HFA 220 MCG/ACT inhaler TAKE 1 PUFF BY MOUTH TWICE A DAY 12 g 2  . hydrochlorothiazide (MICROZIDE) 12.5 MG capsule TAKE 1 CAPSULE BY MOUTH EVERY DAY 90 capsule 1  . KLOR-CON M20 20 MEQ tablet TAKE 1 TABLET BY MOUTH EVERY DAY 90 tablet 1  . pravastatin  (PRAVACHOL) 80 MG tablet TAKE 1 TABLET BY MOUTH EVERY DAY 90 tablet 1  . FARXIGA 10 MG TABS tablet TAKE 1/2 TABLET BY MOUTH EVERY DAY (Patient not taking: Reported on 07/17/2020) 45 tablet 1   No current facility-administered medications on file prior to visit.    Allergies  Allergen Reactions  . Penicillins Rash    Family History  Problem Relation Age of Onset  . Asthma Mother   . Stroke Mother   . Kidney disease Father   . Alcohol abuse Father   . Hypertension Sister   . Diabetes Brother   . Colon cancer Neg Hx   . Esophageal cancer Neg Hx   . Rectal cancer Neg Hx   . Stomach cancer Neg Hx     Social History   Socioeconomic History  . Marital status: Married    Spouse name: Not on file  . Number of children: Not on file  . Years of education: Not on file  . Highest education level: Not on file  Occupational History  . Occupation: [employer]  . Smoking status: Current Every Day Smoker    Packs/day: 0.25    Types: Cigarettes  . Smokeless tobacco: Never Used  . Tobacco comment: quit February 2020  Vaping Use  . Vaping Use: Never used  Substance and Sexual Activity  . Alcohol use: No  . Drug use: No  . Sexual activity: Yes    Birth control/protection: Surgical  Other Topics  Concern  . Not on file  Social History Narrative  . Not on file   Social Determinants of Health   Financial Resource Strain:   . Difficulty of Paying Living Expenses: Not on file  Food Insecurity:   . Worried About Programme researcher, broadcasting/film/video in the Last Year: Not on file  . Ran Out of Food in the Last Year: Not on file  Transportation Needs:   . Lack of Transportation (Medical): Not on file  . Lack of Transportation (Non-Medical): Not on file  Physical Activity:   . Days of Exercise per Week: Not on file  . Minutes of Exercise per Session: Not on file  Stress:   . Feeling of Stress : Not on file  Social Connections:   . Frequency of Communication with Friends and  Family: Not on file  . Frequency of Social Gatherings with Friends and Family: Not on file  . Attends Religious Services: Not on file  . Active Member of Clubs or Organizations: Not on file  . Attends Banker Meetings: Not on file  . Marital Status: Not on file   Review of Systems: Pertinent ROS are listed in HPI  Physical Examination: BP 120/80   Pulse 85   Temp 98.4 F (36.9 C) (Temporal)   Resp 16   Ht 5\' 2"  (1.575 m)   Wt 160 lb (72.6 kg)   SpO2 99%   BMI 29.26 kg/m  General appearance: alert, cooperative, appears stated age and no distress Head: Normocephalic, without obvious abnormality, atraumatic Lungs: clear to auscultation bilaterally Heart: regular rate and rhythm, S1, S2 normal, no murmur, click, rub or gallop Extremities: extremities normal, atraumatic, no cyanosis or edema Pulses: 2+ and symmetric Neurologic: Alert and oriented X 3, normal strength and tone. Normal symmetric reflexes. Normal coordination and gait  MSK -- R shoulder examined. Normal strength. Pain with ER/IR. + Empty can test -- pain but preserved strength.   Assessment/Plan: 1. Type 2 diabetes mellitus without complication, without long-term current use of insulin (HCC) Off of medication. Working hard on weight loss. No complications with DM previously. In prediabetic range at last check. Will repeat labs today as noted below. Immunizations are up-to-date.  - Comprehensive metabolic panel - Hemoglobin A1c - Lipid panel - Urine Microalbumin w/creat. ratio  2. Essential hypertension BP normotensive. Asymptomatic. Continue current regimen.  - Comprehensive metabolic panel  3. Hyperlipidemia associated with type 2 diabetes mellitus (HCC) Taking medications as directed. Tolerating well. Continue hard work with diet and exercise. Repeat fasting lipids today.  4. Cigarette nicotine dependence without complication Limited options due to tolerance. Will start trial of losenges.  Follow-up scheduled.   5. Colon cancer screening Agrees to cologuard screening. Average risk and asymptomatic. Order placed.  - Cologuard  6. Right rotator cuff tendinitis Mild. Rx meloxicam. Supportive measures and OTC medications reviewed. Will refer to Sports medicine if symptoms are not improving with conservative treatment.   7. Encounter for biometric screening Will check fasting lipids, CMP, A1C and microalbumin today.   This visit occurred during the SARS-CoV-2 public health emergency.  Safety protocols were in place, including screening questions prior to the visit, additional usage of staff PPE, and extensive cleaning of exam room while observing appropriate contact time as indicated for disinfecting solutions.

## 2020-07-30 ENCOUNTER — Encounter: Payer: Self-pay | Admitting: Emergency Medicine

## 2020-08-17 ENCOUNTER — Other Ambulatory Visit: Payer: Self-pay | Admitting: Physician Assistant

## 2020-08-17 ENCOUNTER — Other Ambulatory Visit: Payer: Self-pay | Admitting: Emergency Medicine

## 2020-08-17 DIAGNOSIS — E785 Hyperlipidemia, unspecified: Secondary | ICD-10-CM

## 2020-08-17 DIAGNOSIS — E1169 Type 2 diabetes mellitus with other specified complication: Secondary | ICD-10-CM

## 2020-08-17 LAB — COLOGUARD: Cologuard: NEGATIVE

## 2020-08-17 MED ORDER — ROSUVASTATIN CALCIUM 20 MG PO TABS: 20.0000 mg | ORAL_TABLET | Freq: Every day | ORAL | 1 refills | Status: DC

## 2020-09-08 ENCOUNTER — Encounter: Payer: Self-pay | Admitting: Emergency Medicine

## 2020-09-28 ENCOUNTER — Telehealth: Payer: Managed Care, Other (non HMO) | Admitting: Physician Assistant

## 2020-09-28 ENCOUNTER — Telehealth: Payer: Self-pay | Admitting: *Deleted

## 2020-09-28 NOTE — Telephone Encounter (Signed)
1/17 visit had to be rescheduled due to provider leaving early. Called pt to check if she is willing to see another provider virtually. Pt stated that she is being having this pain in her shoulder for awhile. She Saw Cody for this issue and prescribed Meloxicam, she said it didn't help, she stopped it.   the pain is like it was especially she extended over head, and gets sore in the morning if she slept at that side. Pt decline virtual visit with another provider. appt rescheduled for 1/20, and pt advised to call us on Wed to see if there is any in person visits available.

## 2020-10-01 ENCOUNTER — Ambulatory Visit (INDEPENDENT_AMBULATORY_CARE_PROVIDER_SITE_OTHER): Payer: Managed Care, Other (non HMO)

## 2020-10-01 ENCOUNTER — Other Ambulatory Visit: Payer: Managed Care, Other (non HMO)

## 2020-10-01 ENCOUNTER — Other Ambulatory Visit: Payer: Self-pay

## 2020-10-01 ENCOUNTER — Ambulatory Visit (INDEPENDENT_AMBULATORY_CARE_PROVIDER_SITE_OTHER): Payer: Managed Care, Other (non HMO) | Admitting: Physician Assistant

## 2020-10-01 ENCOUNTER — Encounter: Payer: Self-pay | Admitting: Physician Assistant

## 2020-10-01 VITALS — BP 110/80 | HR 100 | Temp 98.3°F | Resp 14 | Ht 62.0 in | Wt 164.0 lb

## 2020-10-01 DIAGNOSIS — M25561 Pain in right knee: Secondary | ICD-10-CM

## 2020-10-01 DIAGNOSIS — L309 Dermatitis, unspecified: Secondary | ICD-10-CM | POA: Diagnosis not present

## 2020-10-01 DIAGNOSIS — M7581 Other shoulder lesions, right shoulder: Secondary | ICD-10-CM

## 2020-10-01 MED ORDER — METHYLPREDNISOLONE 4 MG PO TBPK: ORAL_TABLET | ORAL | 0 refills | Status: DC

## 2020-10-01 MED ORDER — TRIAMCINOLONE ACETONIDE 0.1 % EX CREA: 1.0000 "application " | TOPICAL_CREAM | Freq: Two times a day (BID) | CUTANEOUS | 0 refills | Status: DC

## 2020-10-01 NOTE — Patient Instructions (Addendum)
Please avoid heavy lifting and overexertion.  Wear the knee sleeve as directed. Try to avoid sleeping on the affected shoulder as much as possible.   Please speak with Alcario Drought about your shoulder x-ray. I am setting you up with Sports medicine for further evaluation and management.   Use the triamcinolone for the mild residual dermatitis of the skin. This should clear up over the next week or so. If not, please let me know.

## 2020-10-01 NOTE — Progress Notes (Signed)
Patient presents to clinic today c/o continued pain of right shoulder despite conservative measures and use of meloxicam.  Has been present for greater than 2 months at this point.  Again denies any trauma or injury.  Pain is located in the lateral and posterior shoulder.  Is worse with certain range of motion.  Occasionally will have some tingling in her fingers but not always.  Denies any associated neck pain or headache.  Denies any noted weakness of extremity.  Denies symptoms of left upper extremity.  Over the past 2 weeks patient has also noted some intermittent anterior right knee pain, aching in nature.  Noted initially having some swelling but this is resolved.  Denies any trauma or injury.  Will sometimes note a cracking/rubbing sensation in the knee.  Past Medical History:  Diagnosis Date  . Asthma   . Diabetes mellitus without complication (HCC)   . History of chickenpox   . History of prediabetes   . Hyperlipidemia     Current Outpatient Medications on File Prior to Visit  Medication Sig Dispense Refill  . albuterol (VENTOLIN HFA) 108 (90 Base) MCG/ACT inhaler TAKE 2 PUFFS BY MOUTH EVERY 6 HOURS AS NEEDED FOR WHEEZE OR SHORTNESS OF BREATH 18 g 3  . FLOVENT HFA 220 MCG/ACT inhaler TAKE 1 PUFF BY MOUTH TWICE A DAY 12 g 2  . hydrochlorothiazide (MICROZIDE) 12.5 MG capsule TAKE 1 CAPSULE BY MOUTH EVERY DAY 90 capsule 1  . KLOR-CON M20 20 MEQ tablet TAKE 1 TABLET BY MOUTH EVERY DAY 90 tablet 1  . rosuvastatin (CRESTOR) 20 MG tablet Take 1 tablet (20 mg total) by mouth daily. 90 tablet 1   No current facility-administered medications on file prior to visit.    Allergies  Allergen Reactions  . Penicillins Rash    Family History  Problem Relation Age of Onset  . Asthma Mother   . Stroke Mother   . Kidney disease Father   . Alcohol abuse Father   . Hypertension Sister   . Diabetes Brother   . Colon cancer Neg Hx   . Esophageal cancer Neg Hx   . Rectal cancer Neg Hx   .  Stomach cancer Neg Hx     Social History   Socioeconomic History  . Marital status: Married    Spouse name: Not on file  . Number of children: Not on file  . Years of education: Not on file  . Highest education level: Not on file  Occupational History  . Occupation: [employer]  . Smoking status: Current Every Day Smoker    Packs/day: 0.25    Types: Cigarettes  . Smokeless tobacco: Never Used  . Tobacco comment: quit February 2020  Vaping Use  . Vaping Use: Never used  Substance and Sexual Activity  . Alcohol use: No  . Drug use: No  . Sexual activity: Yes    Birth control/protection: Surgical  Other Topics Concern  . Not on file  Social History Narrative  . Not on file   Social Determinants of Health   Financial Resource Strain: Not on file  Food Insecurity: Not on file  Transportation Needs: Not on file  Physical Activity: Not on file  Stress: Not on file  Social Connections: Not on file   Review of Systems - See HPI.  All other ROS are negative.  BP 110/80   Pulse 100   Temp 98.3 F (36.8 C) (Temporal)   Resp 14   Ht  5\' 2"  (1.575 m)   Wt 164 lb (74.4 kg)   SpO2 99%   BMI 30.00 kg/m   Physical Exam Vitals reviewed.  Constitutional:      Appearance: Normal appearance.  HENT:     Head: Normocephalic and atraumatic.  Cardiovascular:     Rate and Rhythm: Normal rate and regular rhythm.     Pulses: Normal pulses.     Heart sounds: Normal heart sounds.  Pulmonary:     Effort: Pulmonary effort is normal.     Breath sounds: Normal breath sounds.  Musculoskeletal:     Right shoulder: No swelling, deformity, tenderness or bony tenderness. Normal range of motion.     Cervical back: Neck supple.     Right knee: Normal. No swelling, deformity, effusion, erythema or bony tenderness. Normal range of motion. No tenderness. No LCL laxity or MCL laxity. Normal patellar mobility.     Instability Tests: Anterior drawer test negative. Posterior  drawer test negative.     Comments: Pain with internal rotation and external rotation. + empty can test  Skin:      Neurological:     Mental Status: She is alert.     Recent Results (from the past 2160 hour(s))  Comprehensive metabolic panel     Status: None   Collection Time: 07/17/20  8:45 AM  Result Value Ref Range   Sodium 139 135 - 145 mEq/L   Potassium 4.4 3.5 - 5.1 mEq/L   Chloride 102 96 - 112 mEq/L   CO2 28 19 - 32 mEq/L   Glucose, Bld 89 70 - 99 mg/dL   BUN 9 6 - 23 mg/dL   Creatinine, Ser 13/05/21 0.40 - 1.20 mg/dL   Total Bilirubin 0.5 0.2 - 1.2 mg/dL   Alkaline Phosphatase 61 39 - 117 U/L   AST 13 0 - 37 U/L   ALT 12 0 - 35 U/L   Total Protein 7.2 6.0 - 8.3 g/dL   Albumin 4.4 3.5 - 5.2 g/dL   GFR 8.36 629.47 mL/min    Comment: Calculated using the CKD-EPI Creatinine Equation (2021)   Calcium 9.6 8.4 - 10.5 mg/dL  Hemoglobin 09-07-1970     Status: None   Collection Time: 07/17/20  8:45 AM  Result Value Ref Range   Hgb A1c MFr Bld 6.5 4.6 - 6.5 %    Comment: Glycemic Control Guidelines for People with Diabetes:Non Diabetic:  <6%Goal of Therapy: <7%Additional Action Suggested:  >8%   Lipid panel     Status: Abnormal   Collection Time: 07/17/20  8:45 AM  Result Value Ref Range   Cholesterol 159 0 - 200 mg/dL    Comment: ATP III Classification       Desirable:  < 200 mg/dL               Borderline High:  200 - 239 mg/dL          High:  > = 13/05/21 mg/dL   Triglycerides 546 0.0 - 149.0 mg/dL    Comment: Normal:  56.8 mg/dLBorderline High:  150 - 199 mg/dL   HDL <127 51.70 mg/dL   VLDL >01.74 0.0 - 94.4 mg/dL   LDL Cholesterol 96.7 (H) 0 - 99 mg/dL   Total CHOL/HDL Ratio 3     Comment:                Men          Women1/2 Average Risk     3.4  3.3Average Risk          5.0          4.42X Average Risk          9.6          7.13X Average Risk          15.0          11.0                       NonHDL 112.37     Comment: NOTE:  Non-HDL goal should be 30 mg/dL higher than  patient's LDL goal (i.e. LDL goal of < 70 mg/dL, would have non-HDL goal of < 100 mg/dL)  Urine Microalbumin w/creat. ratio     Status: None   Collection Time: 07/17/20  9:31 AM  Result Value Ref Range   Microalb, Ur 1.5 0.0 - 1.9 mg/dL   Creatinine,U 924.4 mg/dL   Microalb Creat Ratio 0.6 0.0 - 30.0 mg/g  Cologuard     Status: None   Collection Time: 08/17/20 12:00 AM  Result Value Ref Range   Cologuard Negative Negative    Assessment/Plan: 1. Dermatitis Noted during examination. Mild eczematous areas with some postinflammatory hyperpigmentation noted. Rx Kenalog. Follow-up if not improving.   2. Right rotator cuff tendinitis Ongoing shoulder pain. Suspect supraspinatus tendinitis. Needs further evaluation and management. Will check plain films to ensure joint remains healthy. No overhead lifting or heavy lifting. Start Medrol pack. Referral to Sports Medicine placed.  - Ambulatory referral to Sports Medicine - DG Shoulder Right; Future  3. Acute pain of right knee Likely OA in nature. Exam unremarkable today RICE discussed. Sleeve applied. Follow-up with SM if not improving.   This visit occurred during the SARS-CoV-2 public health emergency.  Safety protocols were in place, including screening questions prior to the visit, additional usage of staff PPE, and extensive cleaning of exam room while observing appropriate contact time as indicated for disinfecting solutions.     Piedad Climes, PA-C

## 2020-10-02 ENCOUNTER — Ambulatory Visit: Payer: Managed Care, Other (non HMO) | Admitting: Physician Assistant

## 2020-10-09 NOTE — Progress Notes (Signed)
Subjective:   I, Laura Santana, LAT, ATC acting as a scribe for Laura Graham, MD.  I'm seeing this patient as a consultation for Laura Mates, PA-C. Note will be routed back to referring provider/PCP.  CC: Chronic right shoulder pain  HPI: Pt is a 55 y/o female c/o R shoulder pain ongoing to 2+ months w/ no known MOI. PCP prescribed prednisone at last visit, 1/20. Pt locates pain to posterior aspect of R shoulder. Pt notes the shoulder is feeling fine now.  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   Radiates: yes UE numbness/tingling: yes- down to fingers w/ "coldness" UE weakness: no Aggravates: no Rx tried: meloxicam, Medrol pack  Dx imaging: 10/01/20 R shoulder XR  Pt also c/o R knee pain ongoing for about 2 months w/ no known MOI. Pt locates pain to anterior aspect of R knee.   Mechanical symptoms: yes Aggravates:walking Rx tried: brace, predinsone  Past medical history, Surgical history, Family history, Social history, Allergies, and medications have been entered into the medical record, reviewed.   Review of Systems: No new headache, visual changes, nausea, vomiting, diarrhea, constipation, dizziness, abdominal pain, skin rash, fevers, chills, night sweats, weight loss, swollen lymph nodes, body aches, joint swelling, muscle aches, chest pain, shortness of breath, mood changes, visual or auditory hallucinations.   Objective:    Vitals:   10/12/20 1541  BP: 128/86  Pulse: (!) 101  SpO2: 98%   General: Well Developed, well nourished, and in no acute distress.  Neuro/Psych: Alert and oriented x3, extra-ocular muscles intact, able to move all 4 extremities, sensation grossly intact. Skin: Warm and dry, no rashes noted.  Respiratory: Not using accessory muscles, speaking in full sentences, trachea midline.  Cardiovascular: Pulses palpable, no extremity edema. Abdomen: Does not appear distended. MSK: Right  shoulder normal-appearing nontender normal shoulder motion normal strength. Right knee decreased quad bulk otherwise normal.  Normal motion without recommendation.  Nontender. Normal strength. Stable ligamentous exam.  Lab and Radiology Results  X-ray images right knee obtained today personally and independently interpreted Minimal degenerative changes.  No acute fractures. Await formal radiology review  Diagnostic Limited MSK Ultrasound of: Right knee Quad tendon intact normal-appearing.  Trace joint effusion. Patellar tendon normal. Medial and lateral joint lines are normal. Posterior knee no Baker's cyst. Impression: Normal-appearing MSK ultrasound knee   Impression and Recommendations:    Assessment and Plan: 55 y.o. female with right knee pain thought to be due to patellofemoral pain syndrome.  Plan for physical therapy to work on quad strengthening.  Also recommend Voltaren gel.  Recheck back in 6 to 8 weeks.  Right shoulder pain now significantly better status post oral steroid Dosepak.  Concerned this may return once the steroid wears off.  Plan for physical therapy as well.Marland Kitchen  PDMP not reviewed this encounter. Orders Placed This Encounter  Procedures  . [place] LIMITED JOINT SPACE STRUCTURES UP RIGHT(NO LINKED CHARGES)    Standing Status:   Future    Number of Occurrences:   1    Standing Expiration Date:   04/11/2021    Order Specific Question:   Reason for Exam (SYMPTOM  OR DIAGNOSIS REQUIRED)    Answer:   right shoulder pain    Order Specific Question:   Preferred imaging location?    Answer:   Adult nurse Sports Medicine-[facility]  . DG Knee AP/LAT W/Sunrise Right    Standing Status:   Future  Number of Occurrences:   1    Standing Expiration Date:   10/12/2021    Order Specific Question:   Reason for Exam (SYMPTOM  OR DIAGNOSIS REQUIRED)    Answer:   right knee pain    Order Specific Question:   Preferred imaging location?    Answer:   [name]    Order  Specific Question:   Is patient pregnant?    Answer:   No  . Ambulatory referral to Physical Therapy    Referral Priority:   Routine    Referral Type:   Physical Medicine    Referral Reason:   Specialty Services Required    Requested Specialty:   Physical Therapy   No orders of the defined types were placed in this encounter.   Discussed warning signs or symptoms. Please see discharge instructions. Patient expresses understanding.   The above documentation has been reviewed and is accurate and complete Laura Santana, M.D.

## 2020-10-12 ENCOUNTER — Ambulatory Visit (INDEPENDENT_AMBULATORY_CARE_PROVIDER_SITE_OTHER): Payer: Managed Care, Other (non HMO) | Admitting: Family Medicine

## 2020-10-12 ENCOUNTER — Other Ambulatory Visit: Payer: Self-pay

## 2020-10-12 ENCOUNTER — Ambulatory Visit (INDEPENDENT_AMBULATORY_CARE_PROVIDER_SITE_OTHER): Payer: Managed Care, Other (non HMO)

## 2020-10-12 ENCOUNTER — Ambulatory Visit: Payer: Self-pay

## 2020-10-12 ENCOUNTER — Encounter: Payer: Self-pay | Admitting: Family Medicine

## 2020-10-12 VITALS — BP 128/86 | HR 101 | Ht 62.0 in | Wt 166.8 lb

## 2020-10-12 DIAGNOSIS — M25511 Pain in right shoulder: Secondary | ICD-10-CM | POA: Diagnosis not present

## 2020-10-12 DIAGNOSIS — M25561 Pain in right knee: Secondary | ICD-10-CM | POA: Diagnosis not present

## 2020-10-12 NOTE — Patient Instructions (Signed)
Thank you for coming in today.  Please get an Xray today before you leave  I've referred you to Physical Therapy.  Let us know if you don't hear from them in one week.  Please use voltaren gel up to 4x daily for pain as needed.   Recheck with me in about 8 weeks.   Let me know if this is not working.    Patellofemoral Pain Syndrome  Patellofemoral pain syndrome is a condition in which the tissue (cartilage) on the underside of the kneecap (patella) softens or breaks down. This causes pain in the front of the knee. The condition is also called runner's knee or chondromalacia patella. Patellofemoral pain syndrome is most common in young adults who are active in sports. The knee is the largest joint in the body. The patella covers the front of the knee and is attached to muscles above and below the knee. The underside of the patella is covered with a smooth type of cartilage (synovium). The smooth surface helps the patella glide easily when you move your knee. Patellofemoral pain syndrome causes swelling in the joint linings and bone surfaces in the knee. What are the causes? This condition may be caused by:  Overuse of the knee.  Poor alignment of your knee joints.  Weak leg muscles.  A direct hit to your kneecap. What increases the risk? You are more likely to develop this condition if:  You do a lot of activities that can wear down your kneecap. These include: ? Running. ? Squatting. ? Climbing stairs.  You start a new physical activity or exercise program.  You wear shoes that do not fit well.  You do not have good leg strength.  You are overweight. What are the signs or symptoms? The main symptom of this condition is knee pain. This may feel like a dull, aching pain underneath your patella, in the front of your knee. There may be a popping or cracking sound when you move your knee. Pain may get worse with:  Exercise.  Climbing  stairs.  Running.  Jumping.  Squatting.  Kneeling.  Sitting for a long time.  Moving or pushing on your patella. How is this diagnosed? This condition may be diagnosed based on:  Your symptoms and medical history. You may be asked about your recent physical activities and which ones cause knee pain.  A physical exam. This may include: ? Moving your patella back and forth. ? Checking your range of knee motion. ? Having you squat or jump to see if you have pain. ? Checking the strength of your leg muscles.  Imaging tests to confirm the diagnosis. These may include an MRI of your knee. How is this treated? This condition may be treated at home with rest, ice, compression, and elevation (RICE).  Other treatments may include:  NSAIDs, such as ibuprofen.  Physical therapy to stretch and strengthen your leg muscles.  Shoe inserts (orthotics) to take stress off your knee.  A knee brace or knee support.  Adhesive tapes to the skin.  Surgery to remove damaged cartilage or move the patella to a better position. This is rare. Follow these instructions at home: If you have a brace:  Wear the brace as told by your health care provider. Remove it only as told by your health care provider.  Loosen the brace if your toes tingle, become numb, or turn cold and blue.  Keep the brace clean.  If the brace is not waterproof: ? Do  not let it get wet. ? Cover it with a watertight covering when you take a bath or a shower. Managing pain, stiffness, and swelling  If directed, put ice on the painful area. To do this: ? If you have a removable brace, remove it as told by your health care provider. ? Put ice in a plastic bag. ? Place a towel between your skin and the bag. ? Leave the ice on for 20 minutes, 2-3 times a day. ? Remove the ice if your skin turns bright red. This is very important. If you cannot feel pain, heat, or cold, you have a greater risk of damage to the area.  Move  your toes often to reduce stiffness and swelling.  Raise (elevate) the injured area above the level of your heart while you are sitting or lying down.   Activity  Rest your knee.  Avoid activities that cause knee pain.  Perform stretching and strengthening exercises as told by your health care provider or physical therapist.  Return to your normal activities as told by your health care provider. Ask your health care provider what activities are safe for you. General instructions  Take over-the-counter and prescription medicines only as told by your health care provider.  Use splints, braces, knee supports, or walking aids as directed by your health care provider.  Do not use any products that contain nicotine or tobacco, such as cigarettes, e-cigarettes, and chewing tobacco. These can delay healing. If you need help quitting, ask your health care provider.  Keep all follow-up visits. This is important. Contact a health care provider if:  Your symptoms get worse.  You are not improving with home care. Summary  Patellofemoral pain syndrome is a condition in which the tissue (cartilage) on the underside of the kneecap (patella) softens or breaks down.  This condition causes swelling in the joint linings and bone surfaces in the knee. This leads to pain in the front of the knee.  This condition may be treated at home with rest, ice, compression, and elevation (RICE).  Use splints, braces, knee supports, or walking aids as directed by your health care provider. This information is not intended to replace advice given to you by your health care provider. Make sure you discuss any questions you have with your health care provider. Document Revised: 02/12/2020 Document Reviewed: 02/12/2020 Elsevier Patient Education  2021 [employer].

## 2020-10-13 NOTE — Progress Notes (Signed)
Right knee x-ray looks normal to radiology

## 2020-11-06 ENCOUNTER — Other Ambulatory Visit: Payer: Self-pay

## 2020-11-06 ENCOUNTER — Encounter (HOSPITAL_COMMUNITY): Payer: Self-pay

## 2020-11-06 ENCOUNTER — Ambulatory Visit (HOSPITAL_COMMUNITY): Payer: Managed Care, Other (non HMO) | Attending: Family Medicine

## 2020-11-06 DIAGNOSIS — R262 Difficulty in walking, not elsewhere classified: Secondary | ICD-10-CM | POA: Diagnosis present

## 2020-11-06 DIAGNOSIS — M25561 Pain in right knee: Secondary | ICD-10-CM

## 2020-11-06 NOTE — Therapy (Signed)
[facility] [address] [place], [place], [address] Phone: [phone]   Fax:  [phone]  Physical Therapy Evaluation  Patient Details  Name: Laura PlantCassandra Yvette Yung MRN: [id number] Date of Birth: June 13, 1966 Referring Provider (PT): Earma Readingorey Evans, MD   Encounter Date: 11/06/2020   PT End of Session - 11/06/20 0805    Visit Number 1    Number of Visits 8    Date for PT Re-Evaluation 01/01/21    Authorization Type [employer] managed, VL-20, no auth    Authorization - Visit Number 1    Authorization - Number of Visits 20    Progress Note Due on Visit 10    PT Start Time 0808    PT Stop Time 0900    PT Time Calculation (min) 52 min    Activity Tolerance Patient tolerated treatment well           Past Medical History:  Diagnosis Date  . Asthma   . Diabetes mellitus without complication (HCC)   . History of chickenpox   . History of prediabetes   . Hyperlipidemia     Past Surgical History:  Procedure Laterality Date  . CHOLECYSTECTOMY    . TUBAL LIGATION      There were no vitals filed for this visit.    Subjective Assessment - 11/06/20 0810    Subjective Patient reports history of right shoulder pain x 4 months and right knee pain 2 months. 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 Patient has been taking steroid course for 5-7 days which helped the shoulder but not the knee. PAtient reports increase knee pain in AM and with walking during her shift.  Increased pain and difficulty with ascending/descending stairs requiring step-to pattern    How long can you sit comfortably? no issues with sitting    How long can you stand comfortably? 5-10 minutes with left weight shift    How long can you walk comfortably? Reports AM pain which lasts throughout the day    Diagnostic tests x-ray shoulder and knee which were  unrevealing    Currently in Pain? Yes    Pain Score 5     Pain Location Knee    Pain Orientation Right;Anterior;Medial    Pain Descriptors / Indicators Discomfort;Dull;Sore    Pain Type Chronic pain    Pain Onset More than a month ago    Pain Frequency Intermittent    Multiple Pain Sites Yes    Pain Location Shoulder    Pain Orientation Right    Pain Descriptors / Indicators Aching;Nagging    Pain Type Chronic pain    Pain Radiating Towards right lateral shoulder, upper trap    Pain Onset More than a month ago    Pain Frequency Intermittent              [facility] PT Assessment - 11/06/20 0001      Assessment   Medical Diagnosis Right shoulder and Right knee pain    Referring Provider (PT) Earma Readingorey Evans, MD      Balance Screen   Has the patient fallen in the past 6 months No    Has the patient had a decrease in activity level because of a fear of falling?  No    Is the patient reluctant to leave their home because of a fear of falling?  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  Attempted pivot-shift but cannot sustain balance or manage due to right patellar pain      ROM / Strength   AROM / PROM / Strength AROM;Strength      AROM   AROM Assessment Site Shoulder;Knee    Right/Left Shoulder Right;Left    Right  Shoulder Flexion 180 Degrees    Right Shoulder ABduction 180 Degrees    Right Shoulder Internal Rotation 60 Degrees    Right Shoulder External Rotation 60 Degrees    Right/Left Knee Right;Left    Right Knee Extension 0   difficulty with pushing into last few degrees of extension due to patellar pain   Right Knee Flexion 130    Left Knee Extension 0    Left Knee Flexion 130      Strength   Strength Assessment Site Shoulder;Knee;Hip    Right/Left Shoulder Right    Right Shoulder Flexion 5/5    Right Shoulder Extension 5/5    Right Shoulder ABduction 5/5    Right Shoulder Internal Rotation 5/5    Right Shoulder External Rotation 5/5    Right Shoulder Horizontal ABduction 5/5    Right Shoulder Horizontal ADduction 5/5    Right/Left Hip Right    Right Hip Extension 3+/5    Right Hip External Rotation  3+/5    Right Hip Internal Rotation 3+/5    Right Hip ABduction 3+/5    Right Hip ADduction 3+/5    Right/Left Knee Right    Right Knee Flexion 4-/5    Right Knee Extension 4-/5      Palpation   Patella mobility pain with patellar compression Right, pain relieved with medial glide during flexion-extension    Palpation comment lateral tracking right patella appreciated      Ambulation/Gait   Ambulation/Gait Yes    Ambulation/Gait Assistance 7: Independent    Ambulation Distance (Feet) 250 Feet    Assistive device None    Gait Pattern Decreased stance time - right    Ambulation Surface Level    Stairs Yes    Stairs Assistance 6: Modified independent (Device/Increase time)    Stair Management Technique No rails;Step to pattern    Number of Stairs 4    Gait Comments                      Objective measurements completed on examination: See above findings.       [facility] Adult PT Treatment/Exercise - 11/06/20 0001      Exercises   Exercises Knee/Hip      Knee/Hip Exercises: Stretches   Quad Stretch Right;2 reps;60 seconds      Knee/Hip  Exercises: Supine    Quad Sets Strengthening;Right;2 sets;10 reps      Knee/Hip Exercises: Sidelying   Hip ABduction Strengthening;Right;2 sets;10 reps                  PT Education - 11/06/20 0849    Education Details pt education on LE anatomy and patello-femoral kinematics    Person(s) Educated Patient    Methods Explanation    Comprehension Verbalized understanding            PT Short Term Goals - 11/06/20 0914      PT SHORT TERM GOAL #1   Title Patient will report at least 25% improvement in symptoms for improved quality of life.    Time 4    Period Weeks    Status New    Target Date 12/04/20      PT SHORT TERM GOAL #2   Title Patient will be able to ambulate at least 300 feet in in order to demonstrate improved gait speed for community ambulation.    Baseline 250 ft with decreased RLE weight shift    Time 4    Period Weeks    Status New    Target Date 12/04/20      PT SHORT TERM GOAL #3   Title Patient will be independent with HEP in order to improve functional outcomes.    Time 4    Period Weeks    Status New    Target Date 12/04/20      PT SHORT TERM GOAL #4   Title --    Time --    Period --    Status --    Target Date --             PT Long Term Goals - 11/06/20 0916      PT LONG TERM GOAL #1   Title Patient will be able to navigate stairs with reciprocal pattern without compensation in order to demonstrate improved LE strength.    Baseline 5/10 pain with non-reciprocal pattern    Time 8    Period Weeks    Status New    Target Date 01/01/21      PT LONG TERM GOAL #2   Title Patient will report right knee pain not exceeding 2/10 during work shift to improve activity tolerance    Baseline 5/10 right knee with walking/standing    Time 8    Period Weeks    Status New    Target Date 01/01/21                  Plan - 11/06/20 0910    Clinical Impression Statement Patient is a  55 yo presenting to physical therapy with right knee pain and  resolved right shoulder pain. She presents with pain limited deficits in RLE strength, ROM, endurance, postural impairments, and functional mobility with ADL. She is having to modify and restrict ADL as indicated by FOTO score as well as subjective information and objective measures which is affecting overall participation. Patient will benefit from skilled physical therapy in order to improve function and reduce impairment.    Personal Factors and Comorbidities Fitness;Time since onset of injury/illness/exacerbation;Profession    Examination-Activity Limitations Lift;Stand;Stairs;Squat;Locomotion Level;Transfers    Examination-Participation Restrictions Cleaning;Community Activity;Interpersonal Relationship;Occupation    Stability/Clinical Decision Making Stable/Uncomplicated    Clinical Decision Making Low    Rehab Potential Good    PT Frequency Biweekly   patient's work schedule will permit session every other  week   PT Duration 8 weeks    PT Treatment/Interventions ADLs/Self Care Home Management;Aquatic Therapy;Cryotherapy;Electrical Stimulation;Ultrasound;Traction;Gait training;Stair training;Functional mobility training;Therapeutic activities;Therapeutic exercise;Balance training;Patient/family education;Neuromuscular re-education;Manual techniques;Passive range of motion;Taping;Energy conservation;Dry needling;Joint Manipulations    PT Next Visit Plan Continue with treatment for Right knee patello-femoral dysfunction, improve hamstring strength, assess stair negotiation again    PT Home Exercise Plan QS, sidelying hip abd, prone quad stretch    Consulted and Agree with Plan of Care Patient           Patient will benefit from skilled therapeutic intervention in order to improve the following deficits and impairments:  Abnormal gait,Decreased activity tolerance,Decreased balance,Decreased mobility,Decreased endurance,Decreased range of motion,Decreased strength,Difficulty walking,Impaired  perceived functional ability,Improper body mechanics,Pain  Visit Diagnosis: Right knee pain, unspecified chronicity  Difficulty in walking, not elsewhere classified  Difficulty walking down stairs     Problem List Patient Active Problem List   Diagnosis Date Noted  . Cigarette nicotine dependence without complication 08/22/2018  . Visit for preventive health examination 11/29/2017  . Diabetes mellitus (HCC) 11/29/2017  . Tinea pedis of both feet 11/29/2017  . Chronic tension-type headache, not intractable 11/29/2017  . Elevated BP without diagnosis of hypertension 11/29/2017   9:20 AM, 11/06/20 M. Shary Decamp, PT, DPT Physical Therapist- [place] Office Number: [phone]  [facility] [address] [place], [place], [address] Phone: [phone]   Fax:  [phone]  Name: Fumi Guadron MRN: [id number] Date of Birth: 01/01/1966

## 2020-11-06 NOTE — Patient Instructions (Signed)
Access Code: [email or web address] URL: [email or web address] Date: 11/06/2020 Prepared by: Shary Decamp  Exercises Supine Quad Set - 1 x daily - 7 x weekly - 3 sets - 10 reps - 2-3 sec hold Sidelying Hip Abduction - 1 x daily - 7 x weekly - 3 sets - 10 reps - 2 sec hold Prone Quadriceps Stretch with Strap - 1 x daily - 7 x weekly - 3 sets - 5 reps - 30 sec hold

## 2020-11-09 ENCOUNTER — Telehealth (HOSPITAL_COMMUNITY): Payer: Self-pay | Admitting: Physical Therapy

## 2020-11-09 ENCOUNTER — Encounter (HOSPITAL_COMMUNITY): Payer: Managed Care, Other (non HMO) | Admitting: Physical Therapy

## 2020-11-09 NOTE — Telephone Encounter (Signed)
pt called to cx this appt due to she had to help her son move.

## 2020-11-23 ENCOUNTER — Other Ambulatory Visit: Payer: Self-pay

## 2020-11-23 ENCOUNTER — Ambulatory Visit (HOSPITAL_COMMUNITY): Payer: Managed Care, Other (non HMO) | Attending: Family Medicine

## 2020-11-23 DIAGNOSIS — M25561 Pain in right knee: Secondary | ICD-10-CM

## 2020-11-23 DIAGNOSIS — R262 Difficulty in walking, not elsewhere classified: Secondary | ICD-10-CM | POA: Diagnosis present

## 2020-11-23 NOTE — Therapy (Signed)
[facility] [address] [place], [place], [address] Phone: [phone]   Fax:  [phone]  Physical Therapy Treatment  Patient Details  Name: Laura Santana MRN: [id number] Date of Birth: 04-Jul-1966 Referring Provider (PT): Earma Reading, MD   Encounter Date: 11/23/2020   PT End of Session - 11/23/20 1322    Visit Number 2    Number of Visits 8    Date for PT Re-Evaluation 01/01/21    Authorization Type [employer] managed, VL-20, no auth    Authorization - Visit Number 2    Authorization - Number of Visits 20    Progress Note Due on Visit 10    PT Start Time 1300    PT Stop Time 1345    PT Time Calculation (min) 45 min    Activity Tolerance Patient tolerated treatment well           Past Medical History:  Diagnosis Date  . Asthma   . Diabetes mellitus without complication (HCC)   . History of chickenpox   . History of prediabetes   . Hyperlipidemia     Past Surgical History:  Procedure Laterality Date  . CHOLECYSTECTOMY    . TUBAL LIGATION      There were no vitals filed for this visit.   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 no issues with sitting    How long can you stand comfortably? 5-10 minutes with left weight shift    How long can you walk comfortably? Reports AM pain which lasts throughout the day    Diagnostic tests x-ray shoulder and knee which were unrevealing    Currently in Pain? Yes    Pain Score 5     Pain Location Knee    Pain Orientation Right;Anterior    Pain Descriptors / Indicators Dull;Sore    Pain Type Chronic pain    Pain Onset More than a month ago    Pain Onset More than a month ago              [facility] PT  Assessment - 11/23/20 0001      Assessment   Medical Diagnosis Right shoulder and Right knee pain    Referring Provider (PT) Earma Reading, MD                         [facility] Adult PT Treatment/Exercise - 11/23/20 0001      Knee/Hip Exercises: Stretches   Quad Stretch Right;4 reps;60 seconds      Knee/Hip Exercises: Standing   Gait Training backwards walking x 226 ft    Other Standing Knee Exercises sit to stand squats. Sit to stand squats with elevated heels 2x10                  PT Education - 11/23/20 1335    Education Details pt education in LE mechanics and squat form.  Discussion in tendon mechanics and use of jumper's knee brace to alleviate tendon pain    Person(s) Educated Patient    Methods Explanation    Comprehension Verbalized understanding            PT Short Term Goals - 11/06/20  9476      PT SHORT TERM GOAL #1   Title Patient will report at least 25% improvement in symptoms for improved quality of life.    Time 4    Period Weeks    Status New    Target Date 12/04/20      PT SHORT TERM GOAL #2   Title Patient will be able to ambulate at least 300 feet in in order to demonstrate improved gait speed for community ambulation.    Baseline 250 ft with decreased RLE weight shift    Time 4    Period Weeks    Status New    Target Date 12/04/20      PT SHORT TERM GOAL #3   Title Patient will be independent with HEP in order to improve functional outcomes.    Time 4    Period Weeks    Status New    Target Date 12/04/20      PT SHORT TERM GOAL #4   Title --    Time --    Period --    Status --    Target Date --             PT Long Term Goals - 11/06/20 0916      PT LONG TERM GOAL #1   Title Patient will be able to navigate stairs with reciprocal pattern without compensation in order to demonstrate improved LE strength.    Baseline 5/10 pain with non-reciprocal pattern    Time 8    Period Weeks    Status New    Target  Date 01/01/21      PT LONG TERM GOAL #2   Title Patient will report right knee pain not exceeding 2/10 during work shift to improve activity tolerance    Baseline 5/10 right knee with walking/standing    Time 8    Period Weeks    Status New    Target Date 01/01/21                 Plan - 11/23/20 1353    Clinical Impression Statement pt continues to exhibit anterior right knee pain and indicates along patellar tendon with increased pain with quadricep loading and squat position.  Demonstrates poor squat mechanics requiring cues and position modification to improve form and execution.  Continued tx indicated to improve body mechanics and decrease knee pain    Personal Factors and Comorbidities Fitness;Time since onset of injury/illness/exacerbation;Profession    Examination-Activity Limitations Lift;Stand;Stairs;Squat;Locomotion Level;Transfers    Examination-Participation Restrictions Cleaning;Community Activity;Interpersonal Relationship;Occupation    Stability/Clinical Decision Making Stable/Uncomplicated    Rehab Potential Good    PT Frequency Biweekly   patient's work schedule will permit session every other week   PT Duration 8 weeks    PT Treatment/Interventions ADLs/Self Care Home Management;Aquatic Therapy;Cryotherapy;Electrical Stimulation;Ultrasound;Traction;Gait training;Stair training;Functional mobility training;Therapeutic activities;Therapeutic exercise;Balance training;Patient/family education;Neuromuscular re-education;Manual techniques;Passive range of motion;Taping;Energy conservation;Dry needling;Joint Manipulations    PT Next Visit Plan Continue with treatment for Right knee patello-femoral dysfunction, improve hamstring strength, assess stair negotiation again    PT Home Exercise Plan QS, sidelying hip abd, prone quad stretch    Consulted and Agree with Plan of Care Patient           Patient will benefit from skilled therapeutic intervention in order to  improve the following deficits and impairments:  Abnormal gait,Decreased activity tolerance,Decreased balance,Decreased mobility,Decreased endurance,Decreased range of motion,Decreased strength,Difficulty walking,Impaired perceived functional ability,Improper body mechanics,Pain  Visit Diagnosis: Right knee  pain, unspecified chronicity  Difficulty in walking, not elsewhere classified     Problem List Patient Active Problem List   Diagnosis Date Noted  . Cigarette nicotine dependence without complication 08/22/2018  . Visit for preventive health examination 11/29/2017  . Diabetes mellitus (HCC) 11/29/2017  . Tinea pedis of both feet 11/29/2017  . Chronic tension-type headache, not intractable 11/29/2017  . Elevated BP without diagnosis of hypertension 11/29/2017   1:55 PM, 11/23/20 M. Shary Decamp, PT, DPT Physical Therapist- [place] Office Number: [phone]  [facility] [address] [place], [place], [address] Phone: [phone]   Fax:  [phone]  Name: Laura Santana MRN: [id number] Date of Birth: 1966/04/25

## 2020-12-04 NOTE — Progress Notes (Deleted)
   I, Christoper Fabian, LAT, ATC, am serving as scribe for Dr. [name].  Laura Santana is a 55 y.o. female who presents to [facility] at [facility] today for f/u of R shoulder and R knee pain.  She was last seen by Dr. Denyse Amass on 10/12/20 and was referred to PT of which she's completed 2 visits.  Since her last visit, pt reports  Diagnostic imaging: R knee XR- 10/12/20; R shoulder XR- 10/01/20  Pertinent review of systems: ***  Relevant historical information: ***   Exam:  There were no vitals taken for this visit. General: Well Developed, well nourished, and in no acute distress.   MSK: ***    Lab and Radiology Results No results found for this or any previous visit (from the past 72 hour(s)). No results found.     Assessment and Plan: 55 y.o. female with ***   PDMP not reviewed this encounter. No orders of the defined types were placed in this encounter.  No orders of the defined types were placed in this encounter.    Discussed warning signs or symptoms. Please see discharge instructions. Patient expresses understanding.   ***

## 2020-12-07 ENCOUNTER — Ambulatory Visit: Payer: Managed Care, Other (non HMO) | Admitting: Family Medicine

## 2020-12-07 ENCOUNTER — Ambulatory Visit (HOSPITAL_COMMUNITY): Payer: Managed Care, Other (non HMO)

## 2020-12-07 ENCOUNTER — Encounter (HOSPITAL_COMMUNITY): Payer: Self-pay

## 2020-12-07 ENCOUNTER — Other Ambulatory Visit: Payer: Self-pay

## 2020-12-07 DIAGNOSIS — M25561 Pain in right knee: Secondary | ICD-10-CM | POA: Diagnosis not present

## 2020-12-07 DIAGNOSIS — R262 Difficulty in walking, not elsewhere classified: Secondary | ICD-10-CM

## 2020-12-07 NOTE — Patient Instructions (Signed)
Access Code: [email or web address] URL: [email or web address] Date: 12/07/2020 Prepared by: Shary Decamp  Exercises Side Stepping with Resistance at Ankles - 1 x daily - 7 x weekly - 3 sets Standing Hamstring Curl with Resistance - 1 x daily - 7 x weekly - 3 sets - 10 reps - 2 sec hold Standing Quadriceps Stretch - 1 x daily - 7 x weekly - 3 sets - 5 reps - 30-60 sec hold

## 2020-12-07 NOTE — Therapy (Signed)
[facility] [address] [place], [place], [address] Phone: [phone]   Fax:  [phone]  Physical Therapy Treatment  Patient Details  Name: Laura Santana MRN: [id number] Date of Birth: Aug 07, 1966 Referring Provider (PT): Earma Reading, MD   Encounter Date: 12/07/2020   PT End of Session - 12/07/20 1037    Visit Number 3    Number of Visits 8    Date for PT Re-Evaluation 01/01/21    Authorization Type [employer] managed, VL-20, no auth    Authorization - Visit Number 3    Authorization - Number of Visits 20    Progress Note Due on Visit 10    PT Start Time 1031    PT Stop Time 1114    PT Time Calculation (min) 43 min    Activity Tolerance Patient tolerated treatment well    Behavior During Therapy [facility] for tasks assessed/performed           Past Medical History:  Diagnosis Date  . Asthma   . Diabetes mellitus without complication (HCC)   . History of chickenpox   . History of prediabetes   . Hyperlipidemia     Past Surgical History:  Procedure Laterality Date  . CHOLECYSTECTOMY    . TUBAL LIGATION      There were no vitals filed for this visit.   Subjective Assessment - 12/07/20 1036    Subjective Notes continued point tenderness along inferior pole of right patella. Dr appointment tomorrow 3/29 for f/u    How long can you sit comfortably? no issues with sitting    How long can you stand comfortably? 5-10 minutes with left weight shift    How long can you walk comfortably? Reports AM pain which lasts throughout the day    Diagnostic tests x-ray shoulder and knee which were unrevealing    Currently in Pain? Yes    Pain Score 3     Pain Location Knee    Pain Orientation Anterior    Pain Type Chronic pain    Pain Onset More than a month ago    Pain Onset More than a month ago              [facility] PT Assessment - 12/07/20 0001      Assessment   Medical Diagnosis Right shoulder and Right knee pain     Referring Provider (PT) Earma Reading, MD                         [facility] Adult PT Treatment/Exercise - 12/07/20 0001      Knee/Hip Exercises: Stretches   Quad Stretch Right;3 reps;30 seconds    Quad Stretch Limitations standing      Knee/Hip Exercises: Aerobic   Recumbent Bike 5 min level 4 for dynamic warmup      Knee/Hip Exercises: Standing   Knee Flexion Right;Strengthening;2 sets;10 reps    Knee Flexion Limitations green t-loop    Gait Training resisted backwards walking 2x226 ft    Other Standing Knee Exercises squats with heels elevated 2x10                  PT Education - 12/07/20 1102    Education Details education and demonstration of HEP additions    Person(s) Educated Patient    Methods Explanation;Handout;Demonstration    Comprehension Verbalized understanding;Returned demonstration            PT Short Term Goals -  11/06/20 0914      PT SHORT TERM GOAL #1   Title Patient will report at least 25% improvement in symptoms for improved quality of life.    Time 4    Period Weeks    Status New    Target Date 12/04/20      PT SHORT TERM GOAL #2   Title Patient will be able to ambulate at least 300 feet in in order to demonstrate improved gait speed for community ambulation.    Baseline 250 ft with decreased RLE weight shift    Time 4    Period Weeks    Status New    Target Date 12/04/20      PT SHORT TERM GOAL #3   Title Patient will be independent with HEP in order to improve functional outcomes.    Time 4    Period Weeks    Status New    Target Date 12/04/20      PT SHORT TERM GOAL #4   Title --    Time --    Period --    Status --    Target Date --             PT Long Term Goals - 11/06/20 0916      PT LONG TERM GOAL #1   Title Patient will be able to navigate stairs with reciprocal pattern without compensation in order to demonstrate improved LE strength.    Baseline 5/10 pain with non-reciprocal pattern    Time  8    Period Weeks    Status New    Target Date 01/01/21      PT LONG TERM GOAL #2   Title Patient will report right knee pain not exceeding 2/10 during work shift to improve activity tolerance    Baseline 5/10 right knee with walking/standing    Time 8    Period Weeks    Status New    Target Date 01/01/21                 Plan - 12/07/20 1053    Clinical Impression Statement improved squat mechanics with seat behind for tactile cue to improve hip flexion/extension mechanics. Tolerating increased resistance exercises well without adverse effects. Pt demonstrates tenderness to palpation along right pes anserine bursa. Pt has ortho MD appt 12/08/20 and will f/u thereafter    Personal Factors and Comorbidities Fitness;Time since onset of injury/illness/exacerbation;Profession    Examination-Activity Limitations Lift;Stand;Stairs;Squat;Locomotion Level;Transfers    Examination-Participation Restrictions Cleaning;Community Activity;Interpersonal Relationship;Occupation    Stability/Clinical Decision Making Stable/Uncomplicated    Rehab Potential Good    PT Frequency Biweekly   patient's work schedule will permit session every other week   PT Duration 8 weeks    PT Treatment/Interventions ADLs/Self Care Home Management;Aquatic Therapy;Cryotherapy;Electrical Stimulation;Ultrasound;Traction;Gait training;Stair training;Functional mobility training;Therapeutic activities;Therapeutic exercise;Balance training;Patient/family education;Neuromuscular re-education;Manual techniques;Passive range of motion;Taping;Energy conservation;Dry needling;Joint Manipulations    PT Next Visit Plan Continue with treatment for Right knee patello-femoral dysfunction, improve hamstring strength, assess stair negotiation again    PT Home Exercise Plan QS, sidelying hip abd, prone quad stretch    Consulted and Agree with Plan of Care Patient           Patient will benefit from skilled therapeutic intervention  in order to improve the following deficits and impairments:  Abnormal gait,Decreased activity tolerance,Decreased balance,Decreased mobility,Decreased endurance,Decreased range of motion,Decreased strength,Difficulty walking,Impaired perceived functional ability,Improper body mechanics,Pain  Visit Diagnosis: Right knee pain, unspecified chronicity  Difficulty in  walking, not elsewhere classified  Difficulty walking down stairs     Problem List Patient Active Problem List   Diagnosis Date Noted  . Cigarette nicotine dependence without complication 08/22/2018  . Visit for preventive health examination 11/29/2017  . Diabetes mellitus (HCC) 11/29/2017  . Tinea pedis of both feet 11/29/2017  . Chronic tension-type headache, not intractable 11/29/2017  . Elevated BP without diagnosis of hypertension 11/29/2017   11:12 AM, 12/07/20 M. Shary Decamp, PT, DPT Physical Therapist- [place] Office Number: [phone]  [facility] [address] [place], [place], [address] Phone: [phone]   Fax:  [phone]  Name: Malloree Raboin MRN: [id number] Date of Birth: [birth year]

## 2020-12-07 NOTE — Progress Notes (Unsigned)
   I, Christoper Fabian, LAT, ATC, am serving as scribe for Dr. [name].  Abiola Coretta Leisey is a 56 y.o. female who presents to [facility] at [facility] today for f/u of R shoulder and R knee pain.  She was last seen by Dr. Denyse Amass on 10/12/20 and noted improvement in her R shoulder but R knee pain x approximately 2 months.  She was referred to PT and has completed 3 sessions.  Since her last visit, pt reports R shoulder is all better. Pt reports R knee is improved, 80%. Pt locates R knee pain to the anterior aspect of R knee.  Diagnostic imaging: R knee XR- 10/12/20; R shoulder XR- 10/01/20  Pertinent review of systems: No fevers or chills  Relevant historical information: Diabetes   Exam:  BP 118/76 (BP Location: Right Arm, Patient Position: Sitting, Cuff Size: Normal)   Pulse (!) 101   Ht 5\' 2"  (1.575 m)   Wt 169 lb (76.7 kg)   SpO2 99%   BMI 30.91 kg/m  General: Well Developed, well nourished, and in no acute distress.   MSK: Right shoulder normal motion.   Right knee normal-appearing.  Normal gait       Assessment and Plan: 55 y.o. female with significantly improved right knee pain and resolved right shoulder pain.  Continue conservative management with home exercise program and finish out physical therapy.  If needed can resume PT activity in the future.  Would consider trial of injection or even MRI in the future if needed.  Check back as needed.    Discussed warning signs or symptoms. Please see discharge instructions. Patient expresses understanding.   The above documentation has been reviewed and is accurate and complete 44, M.D.  Total encounter time 20 minutes including face-to-face time with the patient and, reviewing past medical record, and charting on the date of service.   Treatment plan and options.

## 2020-12-08 ENCOUNTER — Ambulatory Visit (INDEPENDENT_AMBULATORY_CARE_PROVIDER_SITE_OTHER): Payer: Managed Care, Other (non HMO) | Admitting: Family Medicine

## 2020-12-08 VITALS — BP 118/76 | HR 101 | Ht 62.0 in | Wt 169.0 lb

## 2020-12-08 DIAGNOSIS — M25561 Pain in right knee: Secondary | ICD-10-CM

## 2020-12-08 DIAGNOSIS — M25511 Pain in right shoulder: Secondary | ICD-10-CM | POA: Diagnosis not present

## 2020-12-08 NOTE — Patient Instructions (Signed)
Thank you for coming in today.  Continue limited PT and start home exercises after PT ends.   Recheck with me as needed.   I am happy to re-authorize PT again in the future. Let me know where and when.

## 2020-12-18 ENCOUNTER — Other Ambulatory Visit: Payer: Self-pay

## 2020-12-18 ENCOUNTER — Ambulatory Visit (HOSPITAL_COMMUNITY): Payer: Managed Care, Other (non HMO) | Attending: Family Medicine

## 2020-12-18 DIAGNOSIS — M25561 Pain in right knee: Secondary | ICD-10-CM | POA: Insufficient documentation

## 2020-12-18 DIAGNOSIS — R262 Difficulty in walking, not elsewhere classified: Secondary | ICD-10-CM | POA: Insufficient documentation

## 2020-12-18 NOTE — Therapy (Signed)
[facility] [address] [place], [place], [address] Phone: [phone]   Fax:  [phone]  Physical Therapy Treatment and D/C summary  Patient Details  Name: Laura Santana MRN: [id number] Date of Birth: [birth year] Referring Provider (PT): Sherene Sires, MD  PHYSICAL THERAPY DISCHARGE SUMMARY  Visits from Start of Care: 4  Current functional level related to goals / functional outcomes: Demo improved functional performance and amelioration of knee pain during ambulation and stair negotiation. Able to meet 3/3 STG and 2/3 LTG   Remaining deficits: Continues to experience right knee pain at end of work shift   Education / Equipment: Comprehensive HEP and fitness facility routine  Plan: Patient agrees to discharge.  Patient goals were partially met. Patient is being discharged due to being pleased with the current functional level.  ?????      Encounter Date: 12/18/2020   PT End of Session - 12/18/20 0859    Visit Number 4    Number of Visits 8    Date for PT Re-Evaluation 01/01/21    Authorization Type [employer] managed, VL-20, no auth    Authorization - Visit Number 4    Authorization - Number of Visits 20    Progress Note Due on Visit 10    PT Start Time 0900    PT Stop Time 0930   D/C appointment   PT Time Calculation (min) 30 min    Activity Tolerance Patient tolerated treatment well    Behavior During Therapy WFL for tasks assessed/performed           Past Medical History:  Diagnosis Date  . Asthma   . Diabetes mellitus without complication ([place])   . History of chickenpox   . History of prediabetes   . Hyperlipidemia     Past Surgical History:  Procedure Laterality Date  . CHOLECYSTECTOMY    . TUBAL LIGATION      There were no vitals filed for this visit.   Subjective Assessment - 12/18/20 0927    Subjective Pt reports feeling much better (approx 80%) and ortho MD has released her for follow-up prn.  Pt reports she is interested in returning to fitness facility routine    How long can you sit comfortably? no issues with sitting    How long can you stand comfortably? 5-10 minutes with left weight shift    How long can you walk comfortably? Reports AM pain which lasts throughout the day    Diagnostic tests x-ray shoulder and knee which were unrevealing    Currently in Pain? No/denies    Pain Score 0-No pain    Pain Location Knee    Pain Orientation Right;Anterior    Pain Onset More than a month ago    Pain Onset More than a month ago              [facility] PT Assessment - 12/18/20 0001      Assessment   Medical Diagnosis Right shoulder and Right knee pain    Referring Provider (PT) Sherene Sires, MD                         [facility] Adult PT Treatment/Exercise - 12/18/20 0001      Ambulation/Gait   Ambulation/Gait Yes    Ambulation/Gait Assistance 7: Independent    Ambulation Distance (Feet) 350 Feet    Assistive device None    Gait Pattern Within Functional Limits    Ambulation  Surface Level    Stairs Yes    Stairs Assistance 7: Independent    Stair Management Technique No rails;Alternating pattern    Number of Stairs 8    Gait Comments 2MWT      Knee/Hip Exercises: Machines for Strengthening   Cybex Knee Flexion 4 plates, 3x10      Knee/Hip Exercises: Standing   Other Standing Knee Exercises squats with heels elevated 2x10 10 lbs goblet squat    Other Standing Knee Exercises deadlift 10 lbs dumbell 2x10, stiff-leg deadlift 2x10 10 lbs                  PT Education - 12/18/20 0928    Education Details pt education in body mechanics and improving hip extensor recruitment and postures during resistance exercises to increase hip/knee strength    Person(s) Educated Patient    Methods Explanation;Demonstration    Comprehension Returned demonstration            PT Short Term Goals - 12/18/20 0903      PT SHORT TERM GOAL #1   Title Patient will  report at least 25% improvement in symptoms for improved quality of life.    Baseline 80% improvement    Time 4    Period Weeks    Status Achieved    Target Date 12/04/20      PT SHORT TERM GOAL #2   Title Patient will be able to ambulate at least 300 feet in 2MWT in order to demonstrate improved gait speed for community ambulation.    Baseline 350 ft, no pain or dysfunction    Time 4    Period Weeks    Status Achieved    Target Date 12/04/20      PT SHORT TERM GOAL #3   Title Patient will be independent with HEP in order to improve functional outcomes.    Baseline independent    Time 4    Period Weeks    Status Achieved    Target Date 12/04/20             PT Long Term Goals - 12/18/20 0908      PT LONG TERM GOAL #1   Title Patient will be able to navigate stairs with reciprocal pattern without compensation in order to demonstrate improved LE strength.    Baseline 0/10 knee pain with reciprocal pattern    Time 8    Period Weeks    Status Achieved      PT LONG TERM GOAL #2   Title Patient will report right knee pain not exceeding 2/10 during work shift to improve activity tolerance    Baseline 5/10 right knee with walking/standing at end of shift    Time 8    Period Weeks    Status Not Met                 Plan - 12/18/20 0930    Clinical Impression Statement Demonstrates improved activity tolerance and ability to ambulate and negotiate stairs without pain. Demo improved body mechanics and tolerance to resisted activities without incident. Pt has met 3/3 STG and 2/3 LTG due to continued pain in right knee at end of shift but feels it is tolerable at this time    Personal Factors and Comorbidities Fitness;Time since onset of injury/illness/exacerbation;Profession    Examination-Activity Limitations Lift;Stand;Stairs;Squat;Locomotion Level;Transfers    Examination-Participation Restrictions Cleaning;Community Activity;Interpersonal Relationship;Occupation     Stability/Clinical Decision Making Stable/Uncomplicated    Rehab Potential Good  PT Frequency Biweekly   patient's work schedule will permit session every other week   PT Duration 8 weeks    PT Treatment/Interventions ADLs/Self Care Home Management;Aquatic Therapy;Cryotherapy;Electrical Stimulation;Ultrasound;Traction;Gait training;Stair training;Functional mobility training;Therapeutic activities;Therapeutic exercise;Balance training;Patient/family education;Neuromuscular re-education;Manual techniques;Passive range of motion;Taping;Energy conservation;Dry needling;Joint Manipulations    PT Next Visit Plan D/C to HEP    PT Home Exercise Plan QS, sidelying hip abd, prone quad stretch, goblet squats, deadlift    Consulted and Agree with Plan of Care Patient           Patient will benefit from skilled therapeutic intervention in order to improve the following deficits and impairments:  Abnormal gait,Decreased activity tolerance,Decreased balance,Decreased mobility,Decreased endurance,Decreased range of motion,Decreased strength,Difficulty walking,Impaired perceived functional ability,Improper body mechanics,Pain  Visit Diagnosis: Right knee pain, unspecified chronicity  Difficulty in walking, not elsewhere classified  Difficulty walking down stairs     Problem List Patient Active Problem List   Diagnosis Date Noted  . Cigarette nicotine dependence without complication 07/14/1116  . Visit for preventive health examination 11/29/2017  . Diabetes mellitus ([place]) 11/29/2017  . Tinea pedis of both feet 11/29/2017  . Chronic tension-type headache, not intractable 11/29/2017  . Elevated BP without diagnosis of hypertension 11/29/2017   9:33 AM, 12/18/20 M. Sherlyn Lees, PT, DPT Physical Therapist- [place] Office Number: [phone]  [facility] [address] [place], [place], [address] Phone: [phone]   Fax:  [phone]  Name:  Laura Santana MRN: [id number] Date of Birth: 1966-01-28

## 2021-02-18 ENCOUNTER — Other Ambulatory Visit: Payer: Self-pay

## 2021-02-18 DIAGNOSIS — E785 Hyperlipidemia, unspecified: Secondary | ICD-10-CM

## 2021-02-18 DIAGNOSIS — E1169 Type 2 diabetes mellitus with other specified complication: Secondary | ICD-10-CM

## 2021-02-18 DIAGNOSIS — I10 Essential (primary) hypertension: Secondary | ICD-10-CM

## 2021-02-18 MED ORDER — HYDROCHLOROTHIAZIDE 12.5 MG PO CAPS: ORAL_CAPSULE | ORAL | 0 refills | Status: DC

## 2021-02-18 MED ORDER — ROSUVASTATIN CALCIUM 20 MG PO TABS: 20.0000 mg | ORAL_TABLET | Freq: Every day | ORAL | 0 refills | Status: DC

## 2021-03-10 ENCOUNTER — Ambulatory Visit: Payer: Managed Care, Other (non HMO) | Admitting: Internal Medicine

## 2021-03-26 ENCOUNTER — Ambulatory Visit: Payer: Managed Care, Other (non HMO) | Admitting: Internal Medicine

## 2021-04-01 ENCOUNTER — Other Ambulatory Visit: Payer: Self-pay

## 2021-04-01 ENCOUNTER — Ambulatory Visit (INDEPENDENT_AMBULATORY_CARE_PROVIDER_SITE_OTHER): Payer: Managed Care, Other (non HMO) | Admitting: Registered Nurse

## 2021-04-01 ENCOUNTER — Encounter: Payer: Self-pay | Admitting: Registered Nurse

## 2021-04-01 VITALS — BP 117/76 | HR 70 | Temp 98.3°F | Resp 18 | Ht 62.0 in | Wt 174.4 lb

## 2021-04-01 DIAGNOSIS — E119 Type 2 diabetes mellitus without complications: Secondary | ICD-10-CM | POA: Diagnosis not present

## 2021-04-01 DIAGNOSIS — Z13220 Encounter for screening for lipoid disorders: Secondary | ICD-10-CM

## 2021-04-01 DIAGNOSIS — Z1329 Encounter for screening for other suspected endocrine disorder: Secondary | ICD-10-CM | POA: Diagnosis not present

## 2021-04-01 DIAGNOSIS — U071 COVID-19: Secondary | ICD-10-CM

## 2021-04-01 DIAGNOSIS — E1169 Type 2 diabetes mellitus with other specified complication: Secondary | ICD-10-CM | POA: Diagnosis not present

## 2021-04-01 DIAGNOSIS — I10 Essential (primary) hypertension: Secondary | ICD-10-CM

## 2021-04-01 DIAGNOSIS — Z13228 Encounter for screening for other metabolic disorders: Secondary | ICD-10-CM

## 2021-04-01 DIAGNOSIS — Z130 Encounter for screening for diseases of the blood and blood-forming organs and certain disorders involving the immune mechanism: Secondary | ICD-10-CM

## 2021-04-01 DIAGNOSIS — E785 Hyperlipidemia, unspecified: Secondary | ICD-10-CM

## 2021-04-01 MED ORDER — ALBUTEROL SULFATE HFA 108 (90 BASE) MCG/ACT IN AERS
INHALATION_SPRAY | RESPIRATORY_TRACT | 3 refills | Status: DC
Start: 2021-04-01 — End: 2024-05-30

## 2021-04-01 MED ORDER — ROSUVASTATIN CALCIUM 20 MG PO TABS: 20.0000 mg | ORAL_TABLET | Freq: Every day | ORAL | 1 refills | Status: DC

## 2021-04-01 MED ORDER — POTASSIUM CHLORIDE CRYS ER 20 MEQ PO TBCR: 20.0000 meq | EXTENDED_RELEASE_TABLET | Freq: Every day | ORAL | 1 refills | Status: DC

## 2021-04-01 MED ORDER — HYDROCHLOROTHIAZIDE 12.5 MG PO CAPS: ORAL_CAPSULE | ORAL | 1 refills | Status: DC

## 2021-04-01 NOTE — Progress Notes (Signed)
Established Patient Office Visit  Subjective:  Patient ID: Laura Santana, female    DOB: 06/22/1966  Age: 55 y.o. MRN: [id number]  CC:  Chief Complaint  Patient presents with   Transitions Of Care    Patient states she is here for a TOC. Medication refill.    HPI Laura Santana presents for visit to est care and med refill  Hypertension: Patient Currently taking: hctz 12.5mg  po qd Good effect. No AEs. Denies CV symptoms including: chest pain, shob, doe, headache, visual changes, fatigue, claudication, and dependent edema.   Previous readings and labs: BP Readings from Last 3 Encounters:  04/01/21 117/76  12/08/20 118/76  10/12/20 128/86   Lab Results  Component Value Date   CREATININE 0.65 07/17/2020   HLD Taking rosuvastatin 20mg  PO qd.  Good effect, no AE Lipid Panel     Component Value Date/Time   CHOL 159 07/17/2020 0845   TRIG 62.0 07/17/2020 0845   HDL 46.20 07/17/2020 0845   CHOLHDL 3 07/17/2020 0845   VLDL 12.4 07/17/2020 0845   LDLCALC 100 (H) 07/17/2020 0845    T2dm Last A1c:  Lab Results  Component Value Date   HGBA1C 6.5 07/17/2020    Currently taking: lifestyle control No new complications Reports good compliance with medications Diet has been steady, healthy, improving with current weight loss efforts Exercise habits have been improved with weight loss efforts  Weight Loss [facility] at [facility] On low dose phentermine - helping but needs more time Today is day 3 for her Working with her husband on this  Smoker Smoking cessation discussed. 0.25ppd, 30+ year history with multiple quits throughout Chantix, wellbutrin, and nicotine patches tried in past but intolerable due to AE    Past Medical History:  Diagnosis Date   Asthma    Diabetes mellitus without complication (HCC)    History of chickenpox    History of prediabetes    Hyperlipidemia    Hypertension     Past Surgical History:  Procedure  Laterality Date   CHOLECYSTECTOMY     TUBAL LIGATION      Family History  Problem Relation Age of Onset   Asthma Mother    Stroke Mother    Kidney disease Father    Alcohol abuse Father    Hypertension Sister    Cervical cancer Sister    Thyroid disease Sister    Hyperlipidemia Sister    Diabetes Sister    Hypertension Sister    Heart attack Sister    Diabetes Brother    Colon cancer Neg Hx    Esophageal cancer Neg Hx    Rectal cancer Neg Hx    Stomach cancer Neg Hx     Social History   Socioeconomic History   Marital status: Married    Spouse name: Not on file   Number of children: 2   Years of education: Not on file   Highest education level: Not on file  Occupational History   Occupation: [employer]  Tobacco Use   Smoking status: Every Day    Packs/day: 0.25    Years: 30.00    Pack years: 7.50    Types: Cigarettes   Smokeless tobacco: Never  Vaping Use   Vaping Use: Never used  Substance and Sexual Activity   Alcohol use: No   Drug use: No   Sexual activity: Yes    Birth control/protection: Surgical  Other Topics Concern   Not on file  Social History  Narrative   Not on file   Social Determinants of Health   Financial Resource Strain: Not on file  Food Insecurity: Not on file  Transportation Needs: Not on file  Physical Activity: Not on file  Stress: Not on file  Social Connections: Not on file  Intimate Partner Violence: Not on file    Outpatient Medications Prior to Visit  Medication Sig Dispense Refill   albuterol (VENTOLIN HFA) 108 (90 Base) MCG/ACT inhaler TAKE 2 PUFFS BY MOUTH EVERY 6 HOURS AS NEEDED FOR WHEEZE OR SHORTNESS OF BREATH 18 g 3   hydrochlorothiazide (MICROZIDE) 12.5 MG capsule TAKE 1 CAPSULE BY MOUTH EVERY DAY 90 capsule 0   rosuvastatin (CRESTOR) 20 MG tablet Take 1 tablet (20 mg total) by mouth daily. 90 tablet 0   FLOVENT HFA 220 MCG/ACT inhaler TAKE 1 PUFF BY MOUTH TWICE A DAY (Patient not taking: Reported on  04/01/2021) 12 g 2   methylPREDNISolone (MEDROL DOSEPAK) 4 MG TBPK tablet Take following package directions (Patient not taking: Reported on 04/01/2021) 21 tablet 0   triamcinolone (KENALOG) 0.1 % Apply 1 application topically 2 (two) times daily. (Patient not taking: Reported on 04/01/2021) 30 g 0   KLOR-CON M20 20 MEQ tablet TAKE 1 TABLET BY MOUTH EVERY DAY (Patient not taking: Reported on 04/01/2021) 90 tablet 1   No facility-administered medications prior to visit.    Allergies  Allergen Reactions   Penicillins Rash    ROS Review of Systems  Constitutional: Negative.   HENT: Negative.    Eyes: Negative.   Respiratory: Negative.    Cardiovascular: Negative.   Gastrointestinal: Negative.   Genitourinary: Negative.   Musculoskeletal: Negative.   Skin: Negative.   Neurological: Negative.   Psychiatric/Behavioral: Negative.    All other systems reviewed and are negative.    Objective:    Physical Exam Vitals and nursing note reviewed.  Constitutional:      General: She is not in acute distress.    Appearance: Normal appearance. She is normal weight. She is not ill-appearing, toxic-appearing or diaphoretic.  Cardiovascular:     Rate and Rhythm: Normal rate and regular rhythm.     Heart sounds: Normal heart sounds. No murmur heard.   No friction rub. No gallop.  Pulmonary:     Effort: Pulmonary effort is normal. No respiratory distress.     Breath sounds: Normal breath sounds. No stridor. No wheezing, rhonchi or rales.  Chest:     Chest wall: No tenderness.  Skin:    General: Skin is warm and dry.  Neurological:     General: No focal deficit present.     Mental Status: She is alert and oriented to person, place, and time. Mental status is at baseline.  Psychiatric:        Mood and Affect: Mood normal.        Behavior: Behavior normal.        Thought Content: Thought content normal.        Judgment: Judgment normal.    BP 117/76   Pulse 70   Temp 98.3 F (36.8 C)  (Temporal)   Resp 18   Ht 5\' 2"  (1.575 m)   Wt 174 lb 6.4 oz (79.1 kg)   SpO2 100%   BMI 31.90 kg/m  Wt Readings from Last 3 Encounters:  04/01/21 174 lb 6.4 oz (79.1 kg)  12/08/20 169 lb (76.7 kg)  10/12/20 166 lb 12.8 oz (75.7 kg)     Health Maintenance Due  Topic  Date Due   OPHTHALMOLOGY EXAM  Never done   TETANUS/TDAP  Never done   Zoster Vaccines- Shingrix (1 of 2) Never done   HEMOGLOBIN A1C  01/14/2021    There are no preventive care reminders to display for this patient.  Lab Results  Component Value Date   TSH 0.78 10/08/2018   Lab Results  Component Value Date   WBC 10.0 11/15/2019   HGB 13.7 11/15/2019   HCT 41.5 11/15/2019   MCV 89.2 11/15/2019   PLT 290 11/15/2019   Lab Results  Component Value Date   NA 139 07/17/2020   K 4.4 07/17/2020   CO2 28 07/17/2020   GLUCOSE 89 07/17/2020   BUN 9 07/17/2020   CREATININE 0.65 07/17/2020   BILITOT 0.5 07/17/2020   ALKPHOS 61 07/17/2020   AST 13 07/17/2020   ALT 12 07/17/2020   PROT 7.2 07/17/2020   ALBUMIN 4.4 07/17/2020   CALCIUM 9.6 07/17/2020   GFR 101.49 07/17/2020   Lab Results  Component Value Date   CHOL 159 07/17/2020   Lab Results  Component Value Date   HDL 46.20 07/17/2020   Lab Results  Component Value Date   LDLCALC 100 (H) 07/17/2020   Lab Results  Component Value Date   TRIG 62.0 07/17/2020   Lab Results  Component Value Date   CHOLHDL 3 07/17/2020   Lab Results  Component Value Date   HGBA1C 6.5 07/17/2020      Assessment & Plan:   Problem List Items Addressed This Visit       Endocrine   Diabetes mellitus (HCC)   Relevant Medications   rosuvastatin (CRESTOR) 20 MG tablet   Other Relevant Orders   Hemoglobin A1c   Comprehensive metabolic panel   Lipid panel   TSH   Other Visit Diagnoses     Screening for endocrine, metabolic and immunity disorder    -  Primary   Relevant Orders   CBC with Differential/Platelet   Hemoglobin A1c   Comprehensive  metabolic panel   Lipid panel   TSH   Lipid screening       Relevant Orders   Comprehensive metabolic panel   Lipid panel   Hyperlipidemia associated with type 2 diabetes mellitus (HCC)       Relevant Medications   rosuvastatin (CRESTOR) 20 MG tablet   Other Relevant Orders   Comprehensive metabolic panel   Lipid panel   TSH   Essential hypertension       Relevant Medications   hydrochlorothiazide (MICROZIDE) 12.5 MG capsule   rosuvastatin (CRESTOR) 20 MG tablet   Other Relevant Orders   CBC with Differential/Platelet   Comprehensive metabolic panel   [id number] virus infection       Relevant Medications   albuterol (VENTOLIN HFA) 108 (90 Base) MCG/ACT inhaler       Meds ordered this encounter  Medications   hydrochlorothiazide (MICROZIDE) 12.5 MG capsule    Sig: TAKE 1 CAPSULE BY MOUTH EVERY DAY    Dispense:  90 capsule    Refill:  1    Order Specific Question:   Supervising Provider    Answer:   Neva SeatGREENE, JEFFREY R [[id number]]   albuterol (VENTOLIN HFA) 108 (90 Base) MCG/ACT inhaler    Sig: TAKE 2 PUFFS BY MOUTH EVERY 6 HOURS AS NEEDED FOR WHEEZE OR SHORTNESS OF BREATH    Dispense:  18 g    Refill:  3    Order Specific Question:   Supervising Provider  Answer:   Neva Seat, JEFFREY R [[id number]]   rosuvastatin (CRESTOR) 20 MG tablet    Sig: Take 1 tablet (20 mg total) by mouth daily.    Dispense:  90 tablet    Refill:  1    Order Specific Question:   Supervising Provider    Answer:   Neva Seat, JEFFREY R [[id number]]   potassium chloride SA (KLOR-CON M20) 20 MEQ tablet    Sig: Take 1 tablet (20 mEq total) by mouth daily.    Dispense:  90 tablet    Refill:  1    Order Specific Question:   Supervising Provider    Answer:   Neva Seat, JEFFREY R [[id number]]    Follow-up: Return in about 6 months (around 10/02/2021) for htn, hld, t2dm.   PLAN Refill meds as above.  Follow up in 6 mo Conditions all stable at this time. I commend Ms. Grillot to her commitment to a healthy lifestyle. Should  weight loss continue can consider trial without hctz.  Labs collected. Will follow up with the patient as warranted. Patient encouraged to call clinic with any questions, comments, or concerns.  Janeece Agee, NP

## 2021-04-01 NOTE — Addendum Note (Signed)
Addended by: Janeece Agee on: 04/01/2021 12:23 PM   Modules accepted: Orders

## 2021-04-01 NOTE — Patient Instructions (Addendum)
Ms. Laura Santana to meet you. Looking forward to working with you  I have a lot of respect for the steps you have and are currently taking towards a healthy lifestyle. It has obviously paid off in the lab work, and I can guarantee you'll be thrilled you took these steps in 30 years when your health is still as remarkable.  Let me know if I can do anything for you.  Meds refilled x 6 mo  Labs today will be back tonight or tomorrow  Thank you  Rich     If you have lab work done today you will be contacted with your lab results within the next 2 weeks.  If you have not heard from [place] then please contact us. The fastest way to get your results is to register for My Chart.   IF you received an x-ray today, you will receive an invoice from [facility]. Please contact [facility] at [phone] with questions or concerns regarding your invoice.   IF you received labwork today, you will receive an invoice from [facility]. Please contact [facility] at [phone] with questions or concerns regarding your invoice.   Our billing staff will not be able to assist you with questions regarding bills from these companies.  You will be contacted with the lab results as soon as they are available. The fastest way to get your results is to activate your My Chart account. Instructions are located on the last page of this paperwork. If you have not heard from [place] regarding the results in 2 weeks, please contact this office.

## 2021-04-09 ENCOUNTER — Other Ambulatory Visit (INDEPENDENT_AMBULATORY_CARE_PROVIDER_SITE_OTHER): Payer: Managed Care, Other (non HMO)

## 2021-04-09 ENCOUNTER — Other Ambulatory Visit: Payer: Managed Care, Other (non HMO)

## 2021-04-09 ENCOUNTER — Other Ambulatory Visit: Payer: Self-pay

## 2021-04-09 DIAGNOSIS — E119 Type 2 diabetes mellitus without complications: Secondary | ICD-10-CM | POA: Diagnosis not present

## 2021-04-09 DIAGNOSIS — E785 Hyperlipidemia, unspecified: Secondary | ICD-10-CM

## 2021-04-09 DIAGNOSIS — Z13220 Encounter for screening for lipoid disorders: Secondary | ICD-10-CM

## 2021-04-09 DIAGNOSIS — E1169 Type 2 diabetes mellitus with other specified complication: Secondary | ICD-10-CM | POA: Diagnosis not present

## 2021-04-09 DIAGNOSIS — Z130 Encounter for screening for diseases of the blood and blood-forming organs and certain disorders involving the immune mechanism: Secondary | ICD-10-CM | POA: Diagnosis not present

## 2021-04-09 DIAGNOSIS — Z13228 Encounter for screening for other metabolic disorders: Secondary | ICD-10-CM

## 2021-04-09 DIAGNOSIS — Z1329 Encounter for screening for other suspected endocrine disorder: Secondary | ICD-10-CM | POA: Diagnosis not present

## 2021-04-09 DIAGNOSIS — I10 Essential (primary) hypertension: Secondary | ICD-10-CM

## 2021-04-09 LAB — LIPID PANEL
Cholesterol: 118 mg/dL (ref 0–200)
HDL: 32.4 mg/dL — ABNORMAL LOW (ref >39.00)
LDL Cholesterol: 74 mg/dL (ref 0–99)
NonHDL: 85.88
Total CHOL/HDL Ratio: 4
Triglycerides: 60 mg/dL (ref 0.0–149.0)
VLDL: 12 mg/dL (ref 0.0–40.0)

## 2021-04-09 LAB — COMPREHENSIVE METABOLIC PANEL
ALT: 16 U/L (ref 0–35)
AST: 18 U/L (ref 0–37)
Albumin: 4.2 g/dL (ref 3.5–5.2)
Alkaline Phosphatase: 54 U/L (ref 39–117)
BUN: 12 mg/dL (ref 6–23)
CO2: 28 mEq/L (ref 19–32)
Calcium: 9.7 mg/dL (ref 8.4–10.5)
Chloride: 98 mEq/L (ref 96–112)
Creatinine, Ser: 0.62 mg/dL (ref 0.40–1.20)
GFR: 102.13 mL/min (ref >60.00)
Glucose, Bld: 73 mg/dL (ref 70–99)
Potassium: 4.2 mEq/L (ref 3.5–5.1)
Sodium: 137 mEq/L (ref 135–145)
Total Bilirubin: 0.5 mg/dL (ref 0.2–1.2)
Total Protein: 7 g/dL (ref 6.0–8.3)

## 2021-04-09 LAB — CBC WITH DIFFERENTIAL/PLATELET
Basophils Absolute: 0.1 10*3/uL (ref 0.0–0.1)
Basophils Relative: 0.6 % (ref 0.0–3.0)
Eosinophils Absolute: 0.5 10*3/uL (ref 0.0–0.7)
Eosinophils Relative: 5.3 % — ABNORMAL HIGH (ref 0.0–5.0)
HCT: 40.6 % (ref 36.0–46.0)
Hemoglobin: 13.3 g/dL (ref 12.0–15.0)
Lymphocytes Relative: 21.7 % (ref 12.0–46.0)
Lymphs Abs: 2.2 10*3/uL (ref 0.7–4.0)
MCHC: 32.6 g/dL (ref 30.0–36.0)
MCV: 91.6 fl (ref 78.0–100.0)
Monocytes Absolute: 0.8 10*3/uL (ref 0.1–1.0)
Monocytes Relative: 7.4 % (ref 3.0–12.0)
Neutro Abs: 6.7 10*3/uL (ref 1.4–7.7)
Neutrophils Relative %: 65 % (ref 43.0–77.0)
Platelets: 272 10*3/uL (ref 150.0–400.0)
RBC: 4.43 Mil/uL (ref 3.87–5.11)
RDW: 14.3 % (ref 11.5–15.5)
WBC: 10.3 10*3/uL (ref 4.0–10.5)

## 2021-04-09 LAB — HEMOGLOBIN A1C: Hgb A1c MFr Bld: 6.4 % (ref 4.6–6.5)

## 2021-04-09 LAB — TSH: TSH: 1.9 u[IU]/mL (ref 0.35–5.50)

## 2021-04-30 ENCOUNTER — Ambulatory Visit: Payer: Managed Care, Other (non HMO) | Admitting: Internal Medicine

## 2021-05-20 ENCOUNTER — Other Ambulatory Visit: Payer: Self-pay

## 2021-05-20 DIAGNOSIS — U071 COVID-19: Secondary | ICD-10-CM

## 2021-05-20 MED ORDER — FLUTICASONE PROPIONATE HFA 220 MCG/ACT IN AERO: INHALATION_SPRAY | RESPIRATORY_TRACT | 2 refills | Status: DC

## 2021-06-02 ENCOUNTER — Other Ambulatory Visit: Payer: Self-pay

## 2021-06-02 ENCOUNTER — Encounter: Payer: Self-pay | Admitting: Registered Nurse

## 2021-06-02 ENCOUNTER — Telehealth (INDEPENDENT_AMBULATORY_CARE_PROVIDER_SITE_OTHER): Payer: Managed Care, Other (non HMO) | Admitting: Registered Nurse

## 2021-06-02 DIAGNOSIS — H109 Unspecified conjunctivitis: Secondary | ICD-10-CM

## 2021-06-02 DIAGNOSIS — B9689 Other specified bacterial agents as the cause of diseases classified elsewhere: Secondary | ICD-10-CM

## 2021-06-02 MED ORDER — PREDNISOLONE ACETATE 1 % OP SUSP: 1.0000 [drp] | Freq: Two times a day (BID) | OPHTHALMIC | 0 refills | Status: DC

## 2021-06-02 MED ORDER — ERYTHROMYCIN 5 MG/GM OP OINT: 1.0000 "application " | TOPICAL_OINTMENT | Freq: Four times a day (QID) | OPHTHALMIC | 0 refills | Status: DC

## 2021-06-02 NOTE — Patient Instructions (Signed)
° ° ° °  If you have lab work done today you will be contacted with your lab results within the next 2 weeks.  If you have not heard from us then please contact us. The fastest way to get your results is to register for My Chart. ° ° °IF you received an x-ray today, you will receive an invoice from [facility]. Please contact [facility] at [phone] with questions or concerns regarding your invoice.  ° °IF you received labwork today, you will receive an invoice from [facility]. Please contact [facility] at [phone] with questions or concerns regarding your invoice.  ° °Our billing staff will not be able to assist you with questions regarding bills from these companies. ° °You will be contacted with the lab results as soon as they are available. The fastest way to get your results is to activate your My Chart account. Instructions are located on the last page of this paperwork. If you have not heard from us regarding the results in 2 weeks, please contact this office. °  ° ° ° °

## 2021-06-02 NOTE — Progress Notes (Signed)
Telemedicine Encounter- SOAP NOTE Established Patient  This telephone encounter was conducted with the patient's (or proxy's) verbal consent via audio telecommunications: yes  Patient was instructed to have this encounter in a suitably private space; and to only have persons present to whom they give permission to participate. In addition, patient identity was confirmed by use of name plus two identifiers (DOB and address).  I discussed the limitations, risks, security and privacy concerns of performing an evaluation and management service by telephone and the availability of in person appointments. I also discussed with the patient that there may be a patient responsible charge related to this service. The patient expressed understanding and agreed to proceed.  I spent a total of 16 minutes talking with the patient or their proxy.  Patient at home Provider in office  Participants: Jari Sportsman, NP and Jeanmarie Plant  Chief Complaint  Patient presents with   Eye Pain    Patient states she was having some problems with her left eye and and thought she had pink eye. Patient did a virtual and was told she did have pink eye and received eye drops and now has gotten worse. Patient states both eyes are pink and stuck together.     Subjective   Laura Santana is a 55 y.o. established patient. Telephone visit today for eye pain  HPI Symptoms onset Thursday at work - itching and irritation in eyes Redness throughout eye that day noticed by coworkers Thursday night into Friday more redness, more irritated Tried OTC relief for pink eye - worked on Saturday, but by Sunday, eyes stuck shut  Sunday - had teledoc through with insurance, given polymixin b sulfate trimethoprim  Eyes still irritated, some swelling towards corner of eyes, L>R Looks like stye arising in L eye lower lid.  Patient Active Problem List   Diagnosis Date Noted   Cigarette nicotine dependence without  complication 08/22/2018   Visit for preventive health examination 11/29/2017   Diabetes mellitus (HCC) 11/29/2017   Tinea pedis of both feet 11/29/2017   Chronic tension-type headache, not intractable 11/29/2017   Elevated BP without diagnosis of hypertension 11/29/2017    Past Medical History:  Diagnosis Date   Asthma    Diabetes mellitus without complication (HCC)    History of chickenpox    History of prediabetes    Hyperlipidemia    Hypertension     Current Outpatient Medications  Medication Sig Dispense Refill   albuterol (VENTOLIN HFA) 108 (90 Base) MCG/ACT inhaler TAKE 2 PUFFS BY MOUTH EVERY 6 HOURS AS NEEDED FOR WHEEZE OR SHORTNESS OF BREATH 18 g 3   erythromycin ophthalmic ointment Place 1 application into both eyes 4 (four) times daily. 28 g 0   fluticasone (FLOVENT HFA) 220 MCG/ACT inhaler TAKE 1 PUFF BY MOUTH TWICE A DAY 12 g 2   hydrochlorothiazide (MICROZIDE) 12.5 MG capsule TAKE 1 CAPSULE BY MOUTH EVERY DAY 90 capsule 1   potassium chloride SA (KLOR-CON M20) 20 MEQ tablet Take 1 tablet (20 mEq total) by mouth daily. 90 tablet 1   prednisoLONE acetate (PRED FORTE) 1 % ophthalmic suspension Place 1 drop into both eyes in the morning and at bedtime. 5 mL 0   rosuvastatin (CRESTOR) 20 MG tablet Take 1 tablet (20 mg total) by mouth daily. 90 tablet 1   methylPREDNISolone (MEDROL DOSEPAK) 4 MG TBPK tablet Take following package directions (Patient not taking: No sig reported) 21 tablet 0   triamcinolone (KENALOG) 0.1 % Apply  1 application topically 2 (two) times daily. (Patient not taking: No sig reported) 30 g 0   No current facility-administered medications for this visit.    Allergies  Allergen Reactions   Penicillins Rash    Social History   Socioeconomic History   Marital status: Married    Spouse name: Not on file   Number of children: 2   Years of education: Not on file   Highest education level: Not on file  Occupational History   Occupation: [employer]  Tobacco Use   Smoking status: Every Day    Packs/day: 0.25    Years: 30.00    Pack years: 7.50    Types: Cigarettes   Smokeless tobacco: Never  Vaping Use   Vaping Use: Never used  Substance and Sexual Activity   Alcohol use: No   Drug use: No   Sexual activity: Yes    Birth control/protection: Surgical  Other Topics Concern   Not on file  Social History Narrative   Not on file   Social Determinants of Health   Financial Resource Strain: Not on file  Food Insecurity: Not on file  Transportation Needs: Not on file  Physical Activity: Not on file  Stress: Not on file  Social Connections: Not on file  Intimate Partner Violence: Not on file    ROS  Objective   Vitals as reported by the patient: There were no vitals filed for this visit.  Marva was seen today for eye pain.  Diagnoses and all orders for this visit:  Bacterial conjunctivitis of both eyes -     erythromycin ophthalmic ointment; Place 1 application into both eyes 4 (four) times daily. -     prednisoLONE acetate (PRED FORTE) 1 % ophthalmic suspension; Place 1 drop into both eyes in the morning and at bedtime.   PLAN Switch treatment to above If worsening or persistent seek urgent care in person visit. Can consider ophthalmology referral if recurrent. Patient encouraged to call clinic with any questions, comments, or concerns.  I discussed the assessment and treatment plan with the patient. The patient was provided an opportunity to ask questions and all were answered. The patient agreed with the plan and demonstrated an understanding of the instructions.   The patient was advised to call back or seek an in-person evaluation if the symptoms worsen or if the condition fails to improve as anticipated.  I provided 16 minutes of non-face-to-face time during this encounter.  Janeece Agee, NP

## 2021-06-04 ENCOUNTER — Telehealth: Payer: Self-pay | Admitting: Registered Nurse

## 2021-06-04 NOTE — Telephone Encounter (Signed)
Are okay with drafting a note?

## 2021-06-04 NOTE — Telephone Encounter (Signed)
Patient is requesting a letter for work - states she spoke to you about this during her last visit.  Please advise

## 2021-06-07 NOTE — Telephone Encounter (Signed)
Have given note to patient Thanks  Luan Pulling

## 2021-06-16 ENCOUNTER — Other Ambulatory Visit: Payer: Self-pay | Admitting: Registered Nurse

## 2021-06-16 DIAGNOSIS — Z1231 Encounter for screening mammogram for malignant neoplasm of breast: Secondary | ICD-10-CM

## 2021-07-14 ENCOUNTER — Ambulatory Visit
Admission: RE | Admit: 2021-07-14 | Discharge: 2021-07-14 | Disposition: A | Discharge status: Home / Self Care | Payer: Managed Care, Other (non HMO) | Source: Ambulatory Visit | Attending: Registered Nurse | Admitting: Registered Nurse

## 2021-07-14 DIAGNOSIS — Z1231 Encounter for screening mammogram for malignant neoplasm of breast: Secondary | ICD-10-CM

## 2021-11-08 ENCOUNTER — Encounter: Payer: Self-pay | Admitting: Registered Nurse

## 2021-11-08 ENCOUNTER — Ambulatory Visit (INDEPENDENT_AMBULATORY_CARE_PROVIDER_SITE_OTHER): Payer: Managed Care, Other (non HMO) | Admitting: Registered Nurse

## 2021-11-08 ENCOUNTER — Other Ambulatory Visit: Payer: Self-pay

## 2021-11-08 VITALS — BP 138/65 | HR 90 | Temp 98.2°F | Ht 62.0 in | Wt 146.8 lb

## 2021-11-08 DIAGNOSIS — G43829 Menstrual migraine, not intractable, without status migrainosus: Secondary | ICD-10-CM

## 2021-11-08 DIAGNOSIS — I10 Essential (primary) hypertension: Secondary | ICD-10-CM | POA: Diagnosis not present

## 2021-11-08 MED ORDER — NARATRIPTAN HCL 1 MG PO TABS: 1.0000 mg | ORAL_TABLET | Freq: Once | ORAL | 0 refills | Status: DC | PRN

## 2021-11-08 MED ORDER — AMLODIPINE BESYLATE 5 MG PO TABS
5.0000 mg | ORAL_TABLET | Freq: Every day | ORAL | 1 refills | Status: DC
Start: 2021-11-08 — End: 2022-12-01

## 2021-11-08 NOTE — Patient Instructions (Addendum)
Ms. Mcginty -  Randie Heinz to see you  Start amlodipine 5mg  po qd  Use naratriptan as instructed  Let me know if not effective in the next couple of months  Let's touch base in 6 mo at the latest  Thanks,  Rich     If you have lab work done today you will be contacted with your lab results within the next 2 weeks.  If you have not heard from then please contact us. The fastest way to get your results is to register for My Chart.   IF you received an x-ray today, you will receive an invoice from [facility]. Please contact [facility] at [phone] with questions or concerns regarding your invoice.   IF you received labwork today, you will receive an invoice from [facility]. Please contact [facility] at [phone] with questions or concerns regarding your invoice.   Our billing staff will not be able to assist you with questions regarding bills from these companies.  You will be contacted with the lab results as soon as they are available. The fastest way to get your results is to activate your My Chart account. Instructions are located on the last page of this paperwork. If you have not heard from [phone] regarding the results in 2 weeks, please contact this office.

## 2021-11-08 NOTE — Progress Notes (Signed)
Established Patient Office Visit  Subjective:  Patient ID: Laura Santana, female    DOB: 09-Oct-1965  Age: 56 y.o. MRN: [id number]  CC:  Chief Complaint  Patient presents with   Hypertension    Patient states she is here to discuss medication for bp    HPI Laura Santana presents for htn  Hypertension: Patient Currently taking: no medication - lifestyle management Had been on  Denies CV symptoms including: chest pain, shob, doe, visual changes, fatigue, claudication, and dependent edema.   Previous readings and labs: BP Readings from Last 3 Encounters:  11/08/21 138/65  04/01/21 117/76  12/08/20 118/76   Lab Results  Component Value Date   CREATININE 0.62 04/09/2021   Headaches Frontal, always around menses OTC not effective Has had for a few months No neuro or cognitive changes.   Past Medical History:  Diagnosis Date   Asthma    Diabetes mellitus without complication ([place])    History of chickenpox    History of prediabetes    Hyperlipidemia    Hypertension     Past Surgical History:  Procedure Laterality Date   CHOLECYSTECTOMY     TUBAL LIGATION      Family History  Problem Relation Age of Onset   Asthma Mother    Stroke Mother    Kidney disease Father    Alcohol abuse Father    Hypertension Sister    Cervical cancer Sister    Thyroid disease Sister    Hyperlipidemia Sister    Diabetes Sister    Hypertension Sister    Heart attack Sister    Diabetes Brother    Colon cancer Neg Hx    Esophageal cancer Neg Hx    Rectal cancer Neg Hx    Stomach cancer Neg Hx     Social History   Socioeconomic History   Marital status: Married    Spouse name: Not on file   Number of children: 2   Years of education: Not on file   Highest education level: Not on file  Occupational History   Occupation: [employer]  Tobacco Use   Smoking status: Every Day    Packs/day: 0.25    Years: 30.00    Pack years: 7.50    Types:  Cigarettes   Smokeless tobacco: Never  Vaping Use   Vaping Use: Never used  Substance and Sexual Activity   Alcohol use: No   Drug use: No   Sexual activity: Yes    Birth control/protection: Surgical  Other Topics Concern   Not on file  Social History Narrative   Not on file   Social Determinants of Health   Financial Resource Strain: Not on file  Food Insecurity: Not on file  Transportation Needs: Not on file  Physical Activity: Not on file  Stress: Not on file  Social Connections: Not on file  Intimate Partner Violence: Not on file    Outpatient Medications Prior to Visit  Medication Sig Dispense Refill   albuterol (VENTOLIN HFA) 108 (90 Base) MCG/ACT inhaler TAKE 2 PUFFS BY MOUTH EVERY 6 HOURS AS NEEDED FOR WHEEZE OR SHORTNESS OF BREATH 18 g 3   erythromycin ophthalmic ointment Place 1 application into both eyes 4 (four) times daily. 28 g 0   fluticasone (FLOVENT HFA) 220 MCG/ACT inhaler TAKE 1 PUFF BY MOUTH TWICE A DAY 12 g 2   hydrochlorothiazide (MICROZIDE) 12.5 MG capsule TAKE 1 CAPSULE BY MOUTH EVERY DAY 90 capsule 1   potassium chloride  SA (KLOR-CON M20) 20 MEQ tablet Take 1 tablet (20 mEq total) by mouth daily. 90 tablet 1   prednisoLONE acetate (PRED FORTE) 1 % ophthalmic suspension Place 1 drop into both eyes in the morning and at bedtime. 5 mL 0   rosuvastatin (CRESTOR) 20 MG tablet Take 1 tablet (20 mg total) by mouth daily. 90 tablet 1   methylPREDNISolone (MEDROL DOSEPAK) 4 MG TBPK tablet Take following package directions (Patient not taking: Reported on 04/01/2021) 21 tablet 0   triamcinolone (KENALOG) 0.1 % Apply 1 application topically 2 (two) times daily. (Patient not taking: Reported on 04/01/2021) 30 g 0   No facility-administered medications prior to visit.    Allergies  Allergen Reactions   Penicillins Rash    ROS Review of Systems  Constitutional: Negative.   HENT: Negative.    Eyes: Negative.   Respiratory: Negative.    Cardiovascular:  Negative.   Gastrointestinal: Negative.   Genitourinary: Negative.   Musculoskeletal: Negative.   Skin: Negative.   Neurological:  Positive for headaches.  Psychiatric/Behavioral: Negative.    All other systems reviewed and are negative.    Objective:    Physical Exam Vitals and nursing note reviewed.  Constitutional:      General: She is not in acute distress.    Appearance: Normal appearance. She is normal weight. She is not ill-appearing, toxic-appearing or diaphoretic.  Cardiovascular:     Rate and Rhythm: Normal rate and regular rhythm.     Heart sounds: Normal heart sounds. No murmur heard.   No friction rub. No gallop.  Pulmonary:     Effort: Pulmonary effort is normal. No respiratory distress.     Breath sounds: Normal breath sounds. No stridor. No wheezing, rhonchi or rales.  Chest:     Chest wall: No tenderness.  Skin:    General: Skin is warm and dry.  Neurological:     General: No focal deficit present.     Mental Status: She is alert and oriented to person, place, and time. Mental status is at baseline.  Psychiatric:        Mood and Affect: Mood normal.        Behavior: Behavior normal.        Thought Content: Thought content normal.        Judgment: Judgment normal.    BP 138/65    Pulse 90    Temp 98.2 F (36.8 C) (Temporal)    Ht 5\' 2"  (1.575 m)    Wt 146 lb 12.8 oz (66.6 kg)    SpO2 100%    BMI 26.85 kg/m  Wt Readings from Last 3 Encounters:  11/08/21 146 lb 12.8 oz (66.6 kg)  04/01/21 174 lb 6.4 oz (79.1 kg)  12/08/20 169 lb (76.7 kg)     Health Maintenance Due  Topic Date Due   OPHTHALMOLOGY EXAM  Never done   Zoster Vaccines- Shingrix (1 of 2) Never done   COVID-19 Vaccine (3 - Booster for Pfizer series) 08/23/2020   HEMOGLOBIN A1C  10/10/2021    There are no preventive care reminders to display for this patient.  Lab Results  Component Value Date   TSH 1.90 04/09/2021   Lab Results  Component Value Date   WBC 10.3 04/09/2021   HGB  13.3 04/09/2021   HCT 40.6 04/09/2021   MCV 91.6 04/09/2021   PLT 272.0 04/09/2021   Lab Results  Component Value Date   NA 137 04/09/2021   K 4.2 04/09/2021  CO2 28 04/09/2021   GLUCOSE 73 04/09/2021   BUN 12 04/09/2021   CREATININE 0.62 04/09/2021   BILITOT 0.5 04/09/2021   ALKPHOS 54 04/09/2021   AST 18 04/09/2021   ALT 16 04/09/2021   PROT 7.0 04/09/2021   ALBUMIN 4.2 04/09/2021   CALCIUM 9.7 04/09/2021   GFR 102.13 04/09/2021   Lab Results  Component Value Date   CHOL 118 04/09/2021   Lab Results  Component Value Date   HDL 32.40 (L) 04/09/2021   Lab Results  Component Value Date   LDLCALC 74 04/09/2021   Lab Results  Component Value Date   TRIG 60.0 04/09/2021   Lab Results  Component Value Date   CHOLHDL 4 04/09/2021   Lab Results  Component Value Date   HGBA1C 6.4 04/09/2021      Assessment & Plan:   Problem List Items Addressed This Visit   None Visit Diagnoses     Essential hypertension    -  Primary   Relevant Medications   amLODipine (NORVASC) 5 MG tablet   Menstrual migraine without status migrainosus, not intractable       Relevant Medications   naratriptan (AMERGE) 1 MG TABS tablet   amLODipine (NORVASC) 5 MG tablet       Meds ordered this encounter  Medications   naratriptan (AMERGE) 1 MG TABS tablet    Sig: Take 1 tablet (1 mg total) by mouth once as needed for up to 1 dose. Take one (1) tablet at onset of headache; if returns or does not resolve, may repeat after 4 hours; do not exceed five (5) mg in 24 hours.    Dispense:  60 tablet    Refill:  0    Order Specific Question:   Supervising Provider    Answer:   Carlota Raspberry, JEFFREY R [[id number]]   amLODipine (NORVASC) 5 MG tablet    Sig: Take 1 tablet (5 mg total) by mouth daily.    Dispense:  90 tablet    Refill:  1    Order Specific Question:   Supervising Provider    Answer:   Carlota Raspberry, JEFFREY R [id number]    Follow-up: Return in about 6 months (around 05/08/2022) for htn,  headaches.   PLAN Start amlodipine 5mg  po qd Use naratriptan prn as above Med check in 6 mo at the latest, sooner if poor effect Patient encouraged to call clinic with any questions, comments, or concerns.  Maximiano Coss, NP

## 2022-01-03 ENCOUNTER — Ambulatory Visit (INDEPENDENT_AMBULATORY_CARE_PROVIDER_SITE_OTHER): Payer: Managed Care, Other (non HMO) | Admitting: Registered Nurse

## 2022-01-03 ENCOUNTER — Encounter: Payer: Self-pay | Admitting: Registered Nurse

## 2022-01-03 VITALS — BP 132/84 | HR 85 | Temp 98.2°F | Resp 16 | Ht 62.0 in | Wt 148.5 lb

## 2022-01-03 DIAGNOSIS — Z716 Tobacco abuse counseling: Secondary | ICD-10-CM

## 2022-01-03 DIAGNOSIS — G8929 Other chronic pain: Secondary | ICD-10-CM

## 2022-01-03 DIAGNOSIS — G43829 Menstrual migraine, not intractable, without status migrainosus: Secondary | ICD-10-CM | POA: Diagnosis not present

## 2022-01-03 DIAGNOSIS — M25511 Pain in right shoulder: Secondary | ICD-10-CM

## 2022-01-03 DIAGNOSIS — R21 Rash and other nonspecific skin eruption: Secondary | ICD-10-CM | POA: Diagnosis not present

## 2022-01-03 MED ORDER — METHYLPREDNISOLONE 4 MG PO TBPK: ORAL_TABLET | ORAL | 0 refills | Status: DC

## 2022-01-03 MED ORDER — DICLOFENAC SODIUM 75 MG PO TBEC: 75.0000 mg | DELAYED_RELEASE_TABLET | Freq: Two times a day (BID) | ORAL | 0 refills | Status: DC

## 2022-01-03 MED ORDER — NICOTINE 7 MG/24HR TD PT24: 7.0000 mg | MEDICATED_PATCH | Freq: Every day | TRANSDERMAL | 0 refills | Status: DC

## 2022-01-03 MED ORDER — CYCLOBENZAPRINE HCL 5 MG PO TABS: 5.0000 mg | ORAL_TABLET | Freq: Three times a day (TID) | ORAL | 1 refills | Status: DC | PRN

## 2022-01-03 MED ORDER — TRIAMCINOLONE ACETONIDE 0.1 % EX CREA: 1.0000 "application " | TOPICAL_CREAM | Freq: Two times a day (BID) | CUTANEOUS | 0 refills | Status: DC

## 2022-01-03 MED ORDER — NARATRIPTAN HCL 1 MG PO TABS: 1.0000 mg | ORAL_TABLET | Freq: Once | ORAL | 5 refills | Status: DC | PRN

## 2022-01-03 MED ORDER — NICOTINE 14 MG/24HR TD PT24: 14.0000 mg | MEDICATED_PATCH | Freq: Every day | TRANSDERMAL | 0 refills | Status: DC

## 2022-01-03 MED ORDER — NICOTINE 21 MG/24HR TD PT24: 21.0000 mg | MEDICATED_PATCH | Freq: Every day | TRANSDERMAL | 0 refills | Status: DC

## 2022-01-03 NOTE — Progress Notes (Signed)
? ?Acute Office Visit ? ?Subjective:  ? ? Patient ID: Laura Santana, female    DOB: 11-09-1965, 56 y.o.   MRN: [id number] ? ?Chief Complaint  ?Patient presents with  ? Shoulder Pain  ?  Right shoulder pain that started some months ago ?  ? ? ?HPI ?Patient is in today for R shoulder pain ? ?Onset months ago, worsening ?Works in [employer] - uses it a lot. ?Has used salonpas - could not tolerate. ?Feels like it catches with abduction.  ?Pain with external rotation against resistance, internal rotation when abducted. ?No distal symptoms of radiculopathy or myelopathy. ? ?Does want to discuss smoking cessation.  ?Has been on chantix, wellbutrin, and patches before. ?Wants to try patches again but notes local skin reaction after removal. ?Did have success with smoking cessation in the past with these, though. ?Could not tolerate wellbutrin as it agitated her, chantix caused AE as well.  ? ?Outpatient Medications Prior to Visit  ?Medication Sig Dispense Refill  ? albuterol (VENTOLIN HFA) 108 (90 Base) MCG/ACT inhaler TAKE 2 PUFFS BY MOUTH EVERY 6 HOURS AS NEEDED FOR WHEEZE OR SHORTNESS OF BREATH 18 g 3  ? amLODipine (NORVASC) 5 MG tablet Take 1 tablet (5 mg total) by mouth daily. 90 tablet 1  ? erythromycin ophthalmic ointment Place 1 application into both eyes 4 (four) times daily. 28 g 0  ? fluticasone (FLOVENT HFA) 220 MCG/ACT inhaler TAKE 1 PUFF BY MOUTH TWICE A DAY 12 g 2  ? hydrochlorothiazide (MICROZIDE) 12.5 MG capsule TAKE 1 CAPSULE BY MOUTH EVERY DAY 90 capsule 1  ? phentermine (ADIPEX-P) 37.5 MG tablet Take 37.5 mg by mouth every morning.    ? potassium chloride SA (KLOR-CON M20) 20 MEQ tablet Take 1 tablet (20 mEq total) by mouth daily. 90 tablet 1  ? prednisoLONE acetate (PRED FORTE) 1 % ophthalmic suspension Place 1 drop into both eyes in the morning and at bedtime. 5 mL 0  ? rosuvastatin (CRESTOR) 20 MG tablet Take 1 tablet (20 mg total) by mouth daily. 90 tablet 1  ? naratriptan (AMERGE) 1 MG  TABS tablet Take 1 tablet (1 mg total) by mouth once as needed for up to 1 dose. Take one (1) tablet at onset of headache; if returns or does not resolve, may repeat after 4 hours; do not exceed five (5) mg in 24 hours. 60 tablet 0  ? ?No facility-administered medications prior to visit.  ? ? ?Review of Systems ?Per hpi  ? ?   ?Objective:  ?  ?BP 132/84   Pulse 85   Temp 98.2 ?F (36.8 ?C) (Temporal)   Resp 16   Ht 5\' 2"  (1.575 m)   Wt 148 lb 8 oz (67.4 kg)   SpO2 100%   BMI 27.16 kg/m?  ?Physical Exam ?Vitals and nursing note reviewed.  ?Constitutional:   ?   General: She is not in acute distress. ?   Appearance: Normal appearance. She is normal weight. She is not ill-appearing, toxic-appearing or diaphoretic.  ?Cardiovascular:  ?   Rate and Rhythm: Normal rate and regular rhythm.  ?   Heart sounds: Normal heart sounds. No murmur heard. ?  No friction rub. No gallop.  ?Pulmonary:  ?   Effort: Pulmonary effort is normal. No respiratory distress.  ?   Breath sounds: Normal breath sounds. No stridor. No wheezing, rhonchi or rales.  ?Chest:  ?   Chest wall: No tenderness.  ?Musculoskeletal:     ?   General:  No swelling, tenderness, deformity or signs of injury. Normal range of motion.  ?   Right lower leg: No edema.  ?   Left lower leg: No edema.  ?   Comments: Pain with internal and external rotation, pain with abduction of elbow above shoulder.  ?Skin: ?   General: Skin is warm and dry.  ?Neurological:  ?   General: No focal deficit present.  ?   Mental Status: She is alert and oriented to person, place, and time. Mental status is at baseline.  ?Psychiatric:     ?   Mood and Affect: Mood normal.     ?   Behavior: Behavior normal.     ?   Thought Content: Thought content normal.     ?   Judgment: Judgment normal.  ? ? ?No results found for any visits on 01/03/22. ? ? ?   ?Assessment & Plan:  ?1. Encounter for smoking cessation counseling ?- nicotine (NICOTINE STEP 1) 21 mg/24hr patch; Place 1 patch (21 mg total)  onto the skin daily.  Dispense: 28 patch; Refill: 0 ?- nicotine (NICOTINE STEP 2) 14 mg/24hr patch; Place 1 patch (14 mg total) onto the skin daily.  Dispense: 28 patch; Refill: 0 ?- nicotine (NICOTINE STEP 3) 7 mg/24hr patch; Place 1 patch (7 mg total) onto the skin daily.  Dispense: 28 patch; Refill: 0 ? ?2. Skin rash ?- triamcinolone cream (KENALOG) 0.1 %; Apply 1 application. topically 2 (two) times daily.  Dispense: 30 g; Refill: 0 ? ?3. Menstrual migraine without status migrainosus, not intractable ?- naratriptan (AMERGE) 1 MG TABS tablet; Take 1 tablet (1 mg total) by mouth once as needed for up to 1 dose. Take one (1) tablet at onset of headache; if returns or does not resolve, may repeat after 4 hours; do not exceed five (5) mg in 24 hours.  Dispense: 60 tablet; Refill: 5 ? ?4. Chronic right shoulder pain ?- diclofenac (VOLTAREN) 75 MG EC tablet; Take 1 tablet (75 mg total) by mouth 2 (two) times daily.  Dispense: 30 tablet; Refill: 0 ?- cyclobenzaprine (FLEXERIL) 5 MG tablet; Take 1 tablet (5 mg total) by mouth 3 (three) times daily as needed for muscle spasms.  Dispense: 30 tablet; Refill: 1 ?- methylPREDNISolone (MEDROL DOSEPAK) 4 MG TBPK tablet; Take per package instructions. Do not skip doses. Finish entire supply.  Dispense: 1 each; Refill: 0 ? ? ? ?Meds ordered this encounter  ?Medications  ? nicotine (NICOTINE STEP 1) 21 mg/24hr patch  ?  Sig: Place 1 patch (21 mg total) onto the skin daily.  ?  Dispense:  28 patch  ?  Refill:  0  ?  Order Specific Question:   Supervising Provider  ?  Answer:   Neva Seat, JEFFREY R [[id number]]  ? nicotine (NICOTINE STEP 2) 14 mg/24hr patch  ?  Sig: Place 1 patch (14 mg total) onto the skin daily.  ?  Dispense:  28 patch  ?  Refill:  0  ?  Order Specific Question:   Supervising Provider  ?  Answer:   Neva Seat, JEFFREY R [[id number]]  ? nicotine (NICOTINE STEP 3) 7 mg/24hr patch  ?  Sig: Place 1 patch (7 mg total) onto the skin daily.  ?  Dispense:  28 patch  ?  Refill:  0  ?  Order  Specific Question:   Supervising Provider  ?  Answer:   Neva Seat, JEFFREY R [[id number]]  ? triamcinolone cream (KENALOG) 0.1 %  ?  Sig:  Apply 1 application. topically 2 (two) times daily.  ?  Dispense:  30 g  ?  Refill:  0  ?  Order Specific Question:   Supervising Provider  ?  Answer:   Neva SeatGREENE, JEFFREY R [[id number]]  ? diclofenac (VOLTAREN) 75 MG EC tablet  ?  Sig: Take 1 tablet (75 mg total) by mouth 2 (two) times daily.  ?  Dispense:  30 tablet  ?  Refill:  0  ?  Order Specific Question:   Supervising Provider  ?  Answer:   Neva SeatGREENE, JEFFREY R [[id number]]  ? cyclobenzaprine (FLEXERIL) 5 MG tablet  ?  Sig: Take 1 tablet (5 mg total) by mouth 3 (three) times daily as needed for muscle spasms.  ?  Dispense:  30 tablet  ?  Refill:  1  ?  Order Specific Question:   Supervising Provider  ?  Answer:   Neva SeatGREENE, JEFFREY R [[id number]]  ? naratriptan (AMERGE) 1 MG TABS tablet  ?  Sig: Take 1 tablet (1 mg total) by mouth once as needed for up to 1 dose. Take one (1) tablet at onset of headache; if returns or does not resolve, may repeat after 4 hours; do not exceed five (5) mg in 24 hours.  ?  Dispense:  60 tablet  ?  Refill:  5  ?  Order Specific Question:   Supervising Provider  ?  Answer:   Neva SeatGREENE, JEFFREY R [[id number]]  ? methylPREDNISolone (MEDROL DOSEPAK) 4 MG TBPK tablet  ?  Sig: Take per package instructions. Do not skip doses. Finish entire supply.  ?  Dispense:  1 each  ?  Refill:  0  ?  Order Specific Question:   Supervising Provider  ?  Answer:   Neva SeatGREENE, JEFFREY R [[id number]]  ? ? ?Return if symptoms worsen or fail to improve. ? ?PLAN ?Diclofenac and flexeril for pain, suspect tendonitis. Medrol dose pack to start pain relief. ?Sending nicotine patches for smoking cessation. She has had skin reaction in past, will give triamcinolone to use bid prn. ?Refill naratriptan.  ?Patient encouraged to call clinic with any questions, comments, or concerns. ? ?Janeece Ageeichard Grainger Mccarley, NP ?

## 2022-01-03 NOTE — Patient Instructions (Signed)
Ms. Fimbres -  ? ?Great to see you ? ?Call with concerns ? ?Let me know if pain worsens or fails to improve ? ?Good luck quitting smoking! I know you can! ? ?Thanks, ? ?Rich  ?

## 2022-05-23 IMAGING — DX DG SHOULDER 2+V*R*
3 series · 3 of 3 positions shown · non-contrast
Comparison: None.

CLINICAL DATA: Right shoulder pain without trauma

EXAM:
RIGHT SHOULDER - 2+ VIEW

[shoulder grashey ap]
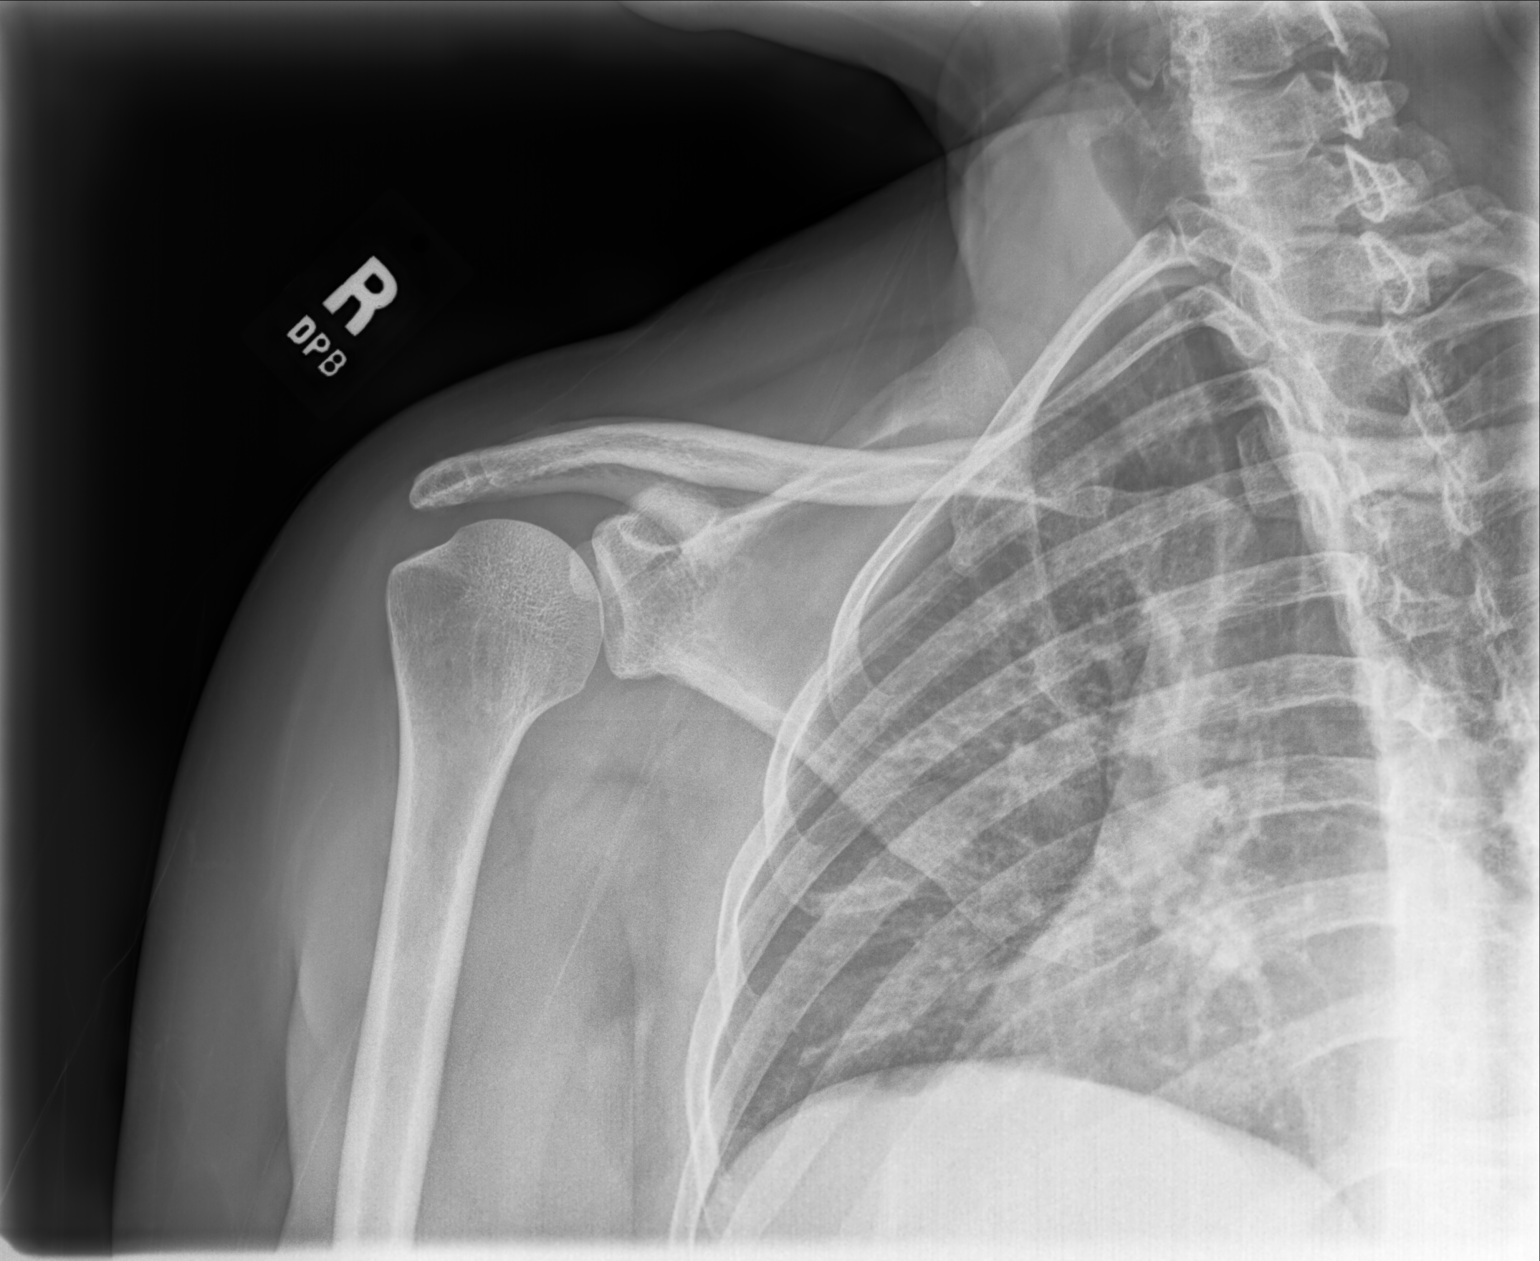

[shoulder y view]
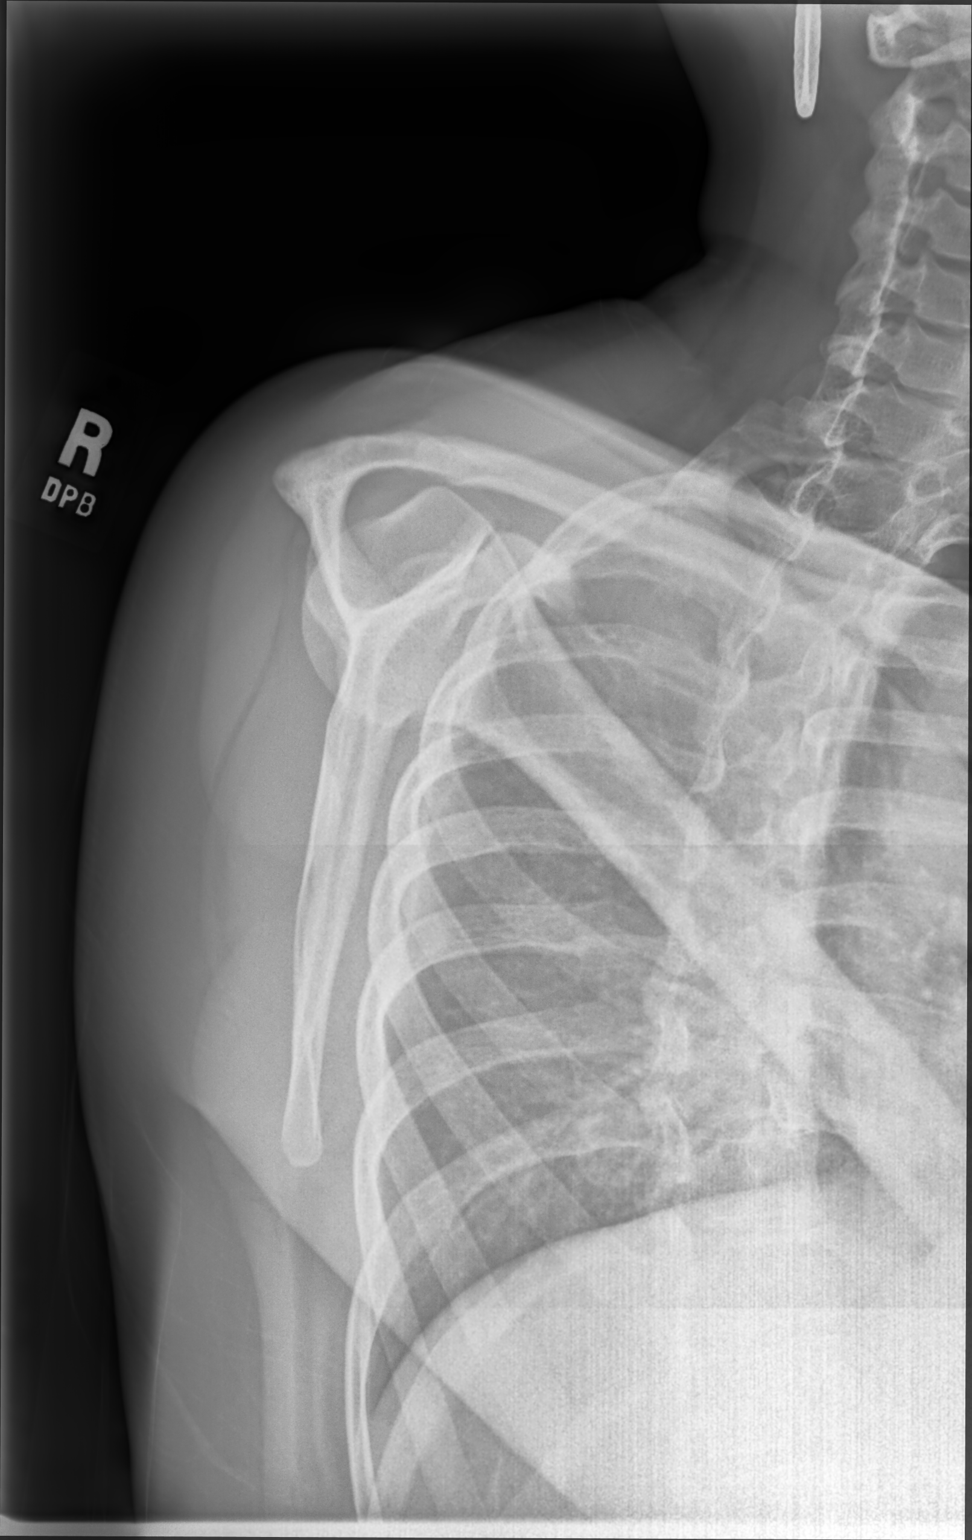

[shoulder axial]
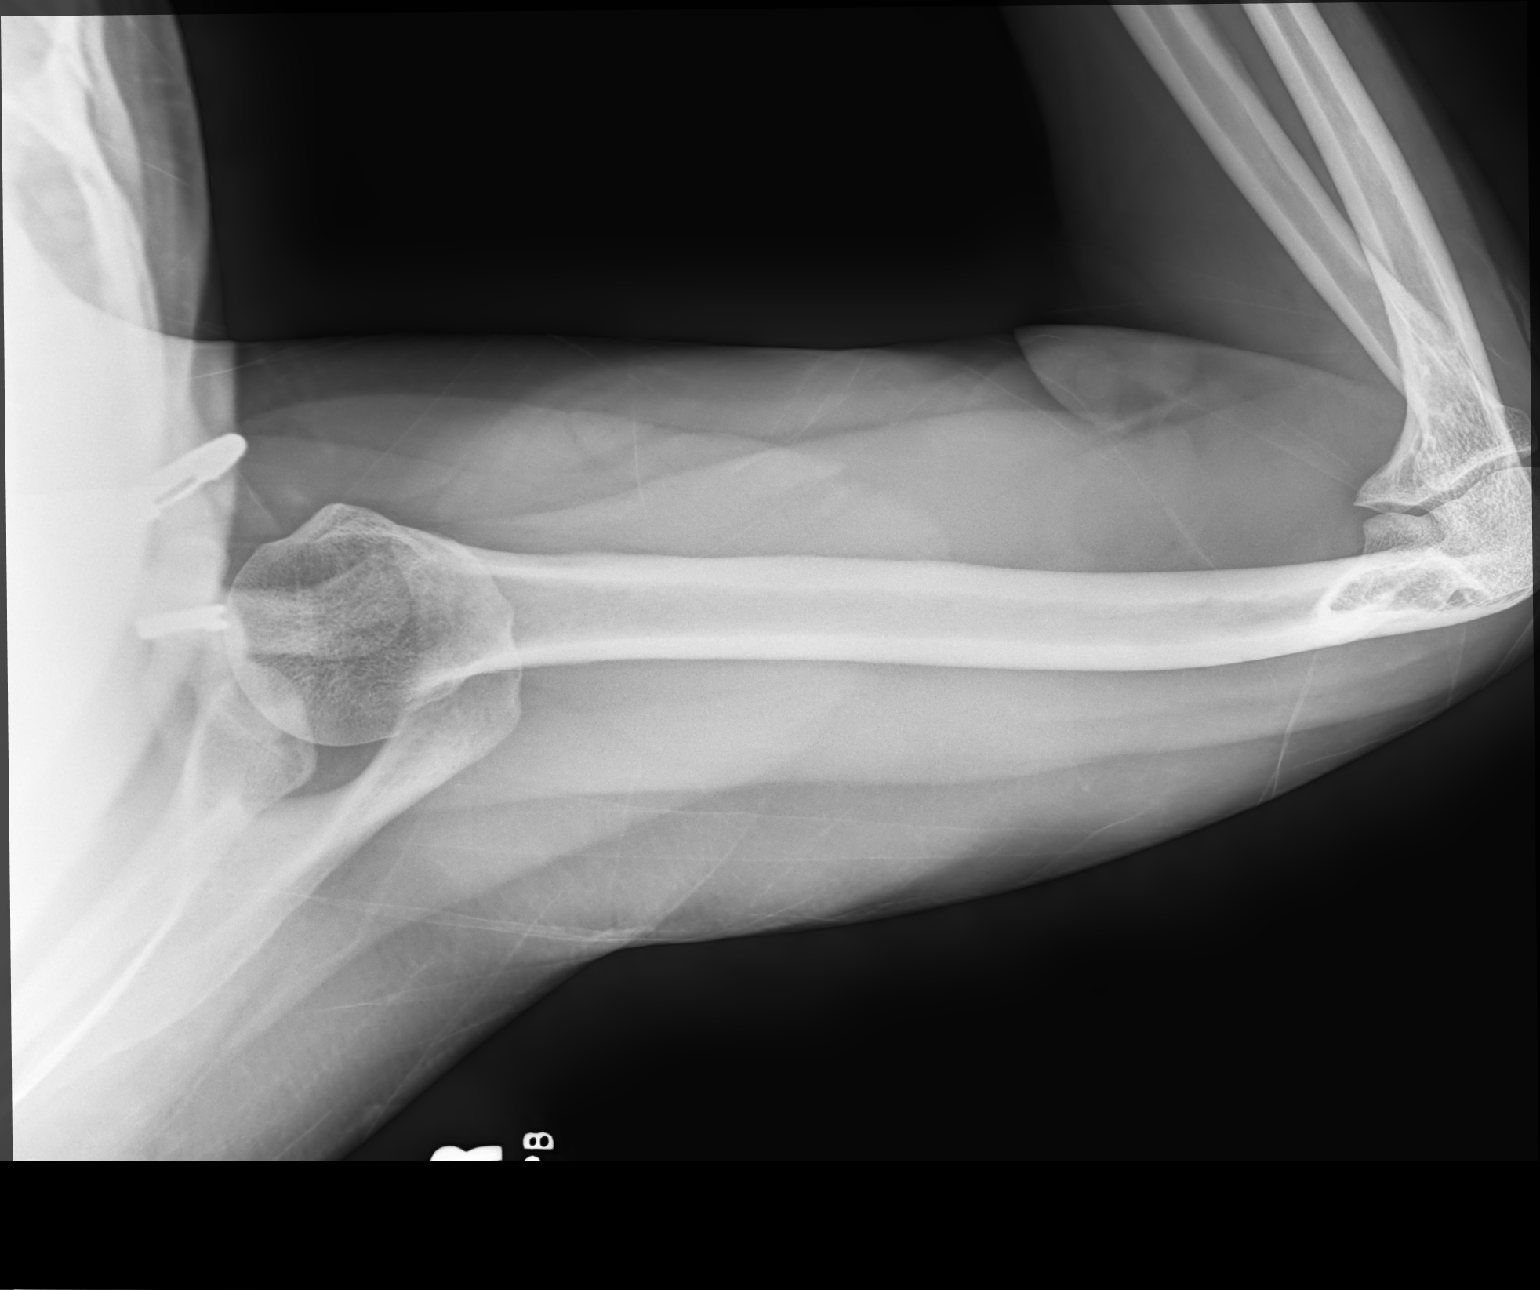

[3 of 3 positions shown; findings below may reference images not displayed]

FINDINGS: No acute fracture or dislocation. Visualized portion of the right
hemithorax is normal.
IMPRESSION: No acute osseous abnormality.

## 2022-07-05 ENCOUNTER — Encounter: Payer: Self-pay | Admitting: Family

## 2022-07-05 ENCOUNTER — Ambulatory Visit (INDEPENDENT_AMBULATORY_CARE_PROVIDER_SITE_OTHER): Payer: Managed Care, Other (non HMO) | Admitting: Family

## 2022-07-05 VITALS — BP 152/96 | HR 81 | Temp 98.2°F | Ht 66.0 in | Wt 156.0 lb

## 2022-07-05 DIAGNOSIS — I10 Essential (primary) hypertension: Secondary | ICD-10-CM | POA: Diagnosis not present

## 2022-07-05 DIAGNOSIS — F419 Anxiety disorder, unspecified: Secondary | ICD-10-CM | POA: Diagnosis not present

## 2022-07-05 DIAGNOSIS — G8929 Other chronic pain: Secondary | ICD-10-CM

## 2022-07-05 DIAGNOSIS — F32A Depression, unspecified: Secondary | ICD-10-CM | POA: Diagnosis not present

## 2022-07-05 DIAGNOSIS — M25511 Pain in right shoulder: Secondary | ICD-10-CM | POA: Diagnosis not present

## 2022-07-05 HISTORY — DX: Other chronic pain: G89.29

## 2022-07-05 MED ORDER — VORTIOXETINE HBR 5 MG PO TABS: 5.0000 mg | ORAL_TABLET | Freq: Every day | ORAL | 0 refills | Status: DC

## 2022-07-05 MED ORDER — OLMESARTAN MEDOXOMIL-HCTZ 20-12.5 MG PO TABS: 1.0000 | ORAL_TABLET | Freq: Every day | ORAL | 1 refills | Status: DC

## 2022-07-05 NOTE — Patient Instructions (Signed)
Welcome to [facility], It was a pleasure meeting you today!    As discussed, I have sent a blood pressure medicine to your pharmacy, start this today and may help with your headaches. Drink at least 2 liters of water every day!  Start the Trintellix today (after eating) - I gave you 2 weeks worth of samples, if we find out this med will not be covered by your insurance, I will send Effexor, and you can start this right away. Let me know if you would like a referral to a therapist, we have one here in our office, Dr. Theodis Shove who does counseling.  I have sent a referral to Dr. Clovis Riley office for your shoulder.  Please schedule a 1 month follow up visit today.    PLEASE NOTE: If you had any LAB tests please let us know if you have not heard back within a few days. You may see your results on MyChart before we have a chance to review them but we will give you a call once they are reviewed by [place]. If we ordered any REFERRALS today, please let us know if you have not heard from their office within the next week.  Let us know through MyChart if you are needing REFILLS, or have your pharmacy send [place] the request. You can also use MyChart to communicate with me or any office staff.

## 2022-07-05 NOTE — Progress Notes (Signed)
Patient ID: Laura Santana, female    DOB: September 07, 1966, 56 y.o.   MRN: [id number]  Chief Complaint  Patient presents with   Transfer of care   Shoulder Pain    Pt c/o right shoulder pain for years. Has tried prednisone in the past which didn't help. Pt states she's not able to sleep in that side and numbness toward to finger tips at times.    Anxiety    Pt states she is worrying a lot and stressed. Has tried Wellbutrin and Paxil which made her shut down and caused mood swings.    Hypertension    Amlodipine caused swelling    HPI: Anxiety & depression:  pt took Wellbutrin in past for smoking, but had SE w/heartburn & Paxil caused her to feel numb. Taking care of oldest granddtr. - then son had MVA in August & in the hosp for a month, now home on feeding tube, requires care, pt also trying to work full time, husband helps some, but also working.    Chronic right shoulder:  she first hurt it last year at work - on pred pack  which helped and pain went away for long time, recently took pred pack again but didn't work this time. She reports arm & hand coldness along with numbness in the hand at times. States it is an achy pain that starts at top and anterior of shoulder and runs down the arm, denies tingling.  Hypertension: Patient is currently maintained on the following medications for blood pressure: used to be on Amlodipine (swelling) & HCTZ (leg pain?pt unsure, but PCP discontinued) Patient reports poor compliance with blood pressure medications - none since first of year. Patient denies chest pain, shortness of breath or swelling, but pt does reports frequent headaches.  Last 3 blood pressure readings in our office are as follows: BP Readings from Last 3 Encounters:  07/05/22 (!) 152/96  01/03/22 132/84  11/08/21 138/65    Assessment & Plan:   Problem List Items Addressed This Visit       Cardiovascular and Mediastinum   Essential hypertension    chronic pt  non-compliant w/meds, none since Jan - states had been taking HCTZ, taken off as thought to be cause of her leg pain, started Norvasc but caused swelling BP high today will start Olmesartan-HCTZ 20-12.5mg  qam, advised on use & SE f/u in 1 month      Relevant Medications   olmesartan-hydrochlorothiazide (BENICAR HCT) 20-12.5 MG tablet     Other   Anxiety and depression - Primary    chronic - started in her 7s, went into remission, and returned about 3-4 months ago. discussed meds & therapy, pt feels overwhelmed, lots of responsibilities,wants to try med first given Trintellix 5mg  samples for 2 weeks, if not approved, will switch to Effexor f/u in 1 month      Relevant Medications   vortioxetine HBr (TRINTELLIX) 5 MG TABS tablet   Chronic right shoulder pain    2022 - pt reports seeing Dr 2023 - given pred pack and PT ordered, reports prednisone helped first time, but not 2nd time. unable to lift arm even to shoulder  advised on alternating heat & ice up to Denyse Amass tid, can take 600mg  Ibuprofen tid after meals referring back to dr             Other Relevant Orders   Ambulatory referral to Sports Medicine   Subjective:    Outpatient Medications Prior to Visit  Medication Sig Dispense Refill   albuterol (VENTOLIN HFA) 108 (90 Base) MCG/ACT inhaler TAKE 2 PUFFS BY MOUTH EVERY 6 HOURS AS NEEDED FOR WHEEZE OR SHORTNESS OF BREATH 18 g 3   diclofenac (VOLTAREN) 75 MG EC tablet Take 1 tablet (75 mg total) by mouth 2 (two) times daily. 30 tablet 0   fluticasone (FLOVENT HFA) 220 MCG/ACT inhaler TAKE 1 PUFF BY MOUTH TWICE A DAY 12 g 2   hydrochlorothiazide (MICROZIDE) 12.5 MG capsule TAKE 1 CAPSULE BY MOUTH EVERY DAY 90 capsule 1   methylPREDNISolone (MEDROL DOSEPAK) 4 MG TBPK tablet Take per package instructions. Do not skip doses. Finish entire supply. 1 each 0   naratriptan (AMERGE) 1 MG TABS tablet Take 1 tablet (1 mg total) by mouth once as needed for up to 1 dose. Take one (1)  tablet at onset of headache; if returns or does not resolve, may repeat after 4 hours; do not exceed five (5) mg in 24 hours. 60 tablet 5   nicotine (NICOTINE STEP 1) 21 mg/24hr patch Place 1 patch (21 mg total) onto the skin daily. 28 patch 0   nicotine (NICOTINE STEP 2) 14 mg/24hr patch Place 1 patch (14 mg total) onto the skin daily. 28 patch 0   nicotine (NICOTINE STEP 3) 7 mg/24hr patch Place 1 patch (7 mg total) onto the skin daily. 28 patch 0   phentermine (ADIPEX-P) 37.5 MG tablet Take 37.5 mg by mouth every morning.     potassium chloride SA (KLOR-CON M20) 20 MEQ tablet Take 1 tablet (20 mEq total) by mouth daily. 90 tablet 1   prednisoLONE acetate (PRED FORTE) 1 % ophthalmic suspension Place 1 drop into both eyes in the morning and at bedtime. 5 mL 0   rosuvastatin (CRESTOR) 20 MG tablet Take 1 tablet (20 mg total) by mouth daily. 90 tablet 1   triamcinolone cream (KENALOG) 0.1 % Apply 1 application. topically 2 (two) times daily. 30 g 0   amLODipine (NORVASC) 5 MG tablet Take 1 tablet (5 mg total) by mouth daily. (Patient not taking: Reported on 07/05/2022) 90 tablet 1   cyclobenzaprine (FLEXERIL) 5 MG tablet Take 1 tablet (5 mg total) by mouth 3 (three) times daily as needed for muscle spasms. (Patient not taking: Reported on 07/05/2022) 30 tablet 1   erythromycin ophthalmic ointment Place 1 application into both eyes 4 (four) times daily. (Patient not taking: Reported on 07/05/2022) 28 g 0   No facility-administered medications prior to visit.   Past Medical History:  Diagnosis Date   Asthma    Diabetes mellitus without complication (HCC)    Elevated BP without diagnosis of hypertension 11/29/2017   History of chickenpox    History of prediabetes    Hyperlipidemia    Hypertension    Tinea pedis of both feet 11/29/2017   Past Surgical History:  Procedure Laterality Date   CHOLECYSTECTOMY     TUBAL LIGATION     Allergies  Allergen Reactions   Omeprazole    Penicillin V     Tetracycline    Penicillins Rash      Objective:    Physical Exam Vitals and nursing note reviewed.  Constitutional:      Appearance: Normal appearance.  Cardiovascular:     Rate and Rhythm: Normal rate and regular rhythm.  Pulmonary:     Effort: Pulmonary effort is normal.     Breath sounds: Normal breath sounds.  Musculoskeletal:        General: Normal range  of motion.     Right shoulder: No swelling. Normal range of motion. Normal strength.  Skin:    General: Skin is warm and dry.  Neurological:     Mental Status: She is alert.  Psychiatric:        Mood and Affect: Mood is depressed. Affect is tearful.        Behavior: Behavior normal.    BP (!) 152/96 (BP Location: Left Arm, Patient Position: Sitting, Cuff Size: Large)   Pulse 81   Temp 98.2 F (36.8 C) (Temporal)   Ht 5\' 6"  (1.676 m)   Wt 156 lb (70.8 kg)   LMP 06/21/2022 (Approximate)   SpO2 96%   BMI 25.18 kg/m  Wt Readings from Last 3 Encounters:  07/05/22 156 lb (70.8 kg)  01/03/22 148 lb 8 oz (67.4 kg)  11/08/21 146 lb 12.8 oz (66.6 kg)      11/10/21, NP

## 2022-07-05 NOTE — Assessment & Plan Note (Signed)
   chronic  pt non-compliant w/meds, none since Jan - states had been taking HCTZ, taken off as thought to be cause of her leg pain, started Norvasc but caused swelling  BP high today  will start Olmesartan-HCTZ 20-12.5mg  qam, advised on use & SE  f/u in 1 month

## 2022-07-05 NOTE — Assessment & Plan Note (Signed)
   2022 - pt reports seeing Dr [name] - given pred pack and PT ordered, reports prednisone helped first time, but not 2nd time.  unable to lift arm even to shoulder   advised on alternating heat & ice up to 11min tid, can take 600mg  Ibuprofen tid after meals  referring back to dr [name]

## 2022-07-05 NOTE — Assessment & Plan Note (Signed)
   chronic - started in her 28s, went into remission, and returned about 3-4 months ago.  discussed meds & therapy, pt feels overwhelmed, lots of responsibilities,wants to try med first  given Trintellix 5mg  samples for 2 weeks, if not approved, will switch to Effexor  f/u in 1 month

## 2022-07-14 ENCOUNTER — Encounter: Payer: Self-pay | Admitting: Family Medicine

## 2022-07-28 ENCOUNTER — Ambulatory Visit: Payer: Managed Care, Other (non HMO)

## 2022-07-28 ENCOUNTER — Ambulatory Visit (INDEPENDENT_AMBULATORY_CARE_PROVIDER_SITE_OTHER): Payer: Managed Care, Other (non HMO) | Admitting: Family Medicine

## 2022-07-28 ENCOUNTER — Ambulatory Visit: Payer: Self-pay

## 2022-07-28 VITALS — BP 148/84 | HR 106 | Ht 66.0 in | Wt 159.0 lb

## 2022-07-28 DIAGNOSIS — G5601 Carpal tunnel syndrome, right upper limb: Secondary | ICD-10-CM | POA: Diagnosis not present

## 2022-07-28 DIAGNOSIS — M25511 Pain in right shoulder: Secondary | ICD-10-CM

## 2022-07-28 MED ORDER — GABAPENTIN 100 MG PO CAPS: 100.0000 mg | ORAL_CAPSULE | Freq: Every evening | ORAL | 3 refills | Status: DC | PRN

## 2022-07-28 NOTE — Patient Instructions (Addendum)
Thank you for coming in today.   Please get an Xray today before you leave   You received an injection today. Seek immediate medical attention if the joint becomes red, extremely painful, or is oozing fluid.   I've referred you to Physical Therapy.  Let us know if you don't hear from them in one week.   I've sent a prescription for Gabapentin to your pharmacy.   Check back in 6 weeks.

## 2022-07-28 NOTE — Progress Notes (Signed)
I, [name], LAT, ATC acting as a scribe for [name], MD.  Laura Santana is a 56 y.o. female who presents to [facility] at [facility] today for re-occurring R shoulder pain. Pt was last seen by Dr. Denyse Amass on 12/08/20 for her R shoulder and was advised to finish out PT and cont HEP. Today, pt reports R shoulder pain returning around April-May. Pt's PCP prescribed prednisone. Pt c/o shaking into her R hand when she tries to pick up objection. Pt locates pain to to all over R shoulder w/ radiating pain distally to R hand.  Aggravates: shoulder aBd, prednisone,   Dx imaging: 10/01/20 R shoulder XR  Pertinent review of systems: No fevers or chills  Relevant historical information: Diabetes Relevant social history: Her son was in a severe motor vehicle collision in 2023-05-11 and almost died and was in the hospital or ICU for 2 months.  He has been discharged and is doing pretty well.  She is very thankful but this obviously was a very stressful experience for her.  Exam:  BP (!) 148/84   Pulse (!) 106   Ht 5\' 6"  (1.676 m)   Wt 159 lb (72.1 kg)   LMP 06/21/2022 (Approximate)   SpO2 98%   BMI 25.66 kg/m  General: Well Developed, well nourished, and in no acute distress.   MSK:  C-spine: Normal. Nontender. With cervical motion. Negative Spurling's test. Upper extremity strength and reflexes are intact.  Right shoulder: Normal-appearing Nontender. Range of motion abduction limited to 100 degrees.  External rotation 20 degrees beyond neutral position functional internal rotation posterior iliac crest. Strength intact within limits of motion. Positive Hawkins and Neer's test. Negative Yergason speeds test.  Right wrist normal appearing Nontender. Positive Tinel's and Phalen's test carpal tunnel.  Grip strength is intact.    Lab and Radiology Results  Procedure: Real-time Ultrasound Guided Injection of right shoulder glenohumeral joint posterior  approach Device: Philips Affiniti 50G Images permanently stored and available for review in PACS Ultrasound evaluation prior to injection reveals intact rotator cuff tendons with mild subacromial bursitis. Verbal informed consent obtained.  Discussed risks and benefits of procedure. Warned about infection, bleeding, hyperglycemia damage to structures among others. Patient expresses understanding and agreement Time-out conducted.   Noted no overlying erythema, induration, or other signs of local infection.   Skin prepped in a sterile fashion.   Local anesthesia: Topical Ethyl chloride.   With sterile technique and under real time ultrasound guidance: 40 mg of Kenalog and 2 mL of Marcaine injected into glenohumeral joint. Fluid seen entering the joint capsule.   Completed without difficulty   Pain immediately resolved suggesting accurate placement of the medication.   Advised to call if fevers/chills, erythema, induration, drainage, or persistent bleeding.   Images permanently stored and available for review in the ultrasound unit.  Impression: Technically successful ultrasound guided injection.   X-ray images right shoulder obtained today personally and independently interpreted Minimal glenohumeral DJD.  No severe AC DJD.  No acute fractures are visible. Await formal radiology review      Assessment and Plan: 56 y.o. female with right shoulder pain with lack of range of motion.  This is concerning for adhesive capsulitis or perhaps rotator cuff tendinopathy or impingement.  Plan for glenohumeral injection as above and referral to physical therapy.  She has pain or paresthesias distally.  This is most likely to be carpal tunnel syndrome based on her physical exam.  Cervical radiculopathy is less  likely.  Plan for night splint and gabapentin.  Recheck in 6 weeks.  PDMP not reviewed this encounter. Orders Placed This Encounter  Procedures   [place] LIMITED JOINT SPACE STRUCTURES UP  RIGHT(NO LINKED CHARGES)    Order Specific Question:   Reason for Exam (SYMPTOM  OR DIAGNOSIS REQUIRED)    Answer:   right shoulder pain    Order Specific Question:   Preferred imaging location?    Answer:   [facility] Shoulder Right    Standing Status:   Future    Number of Occurrences:   1    Standing Expiration Date:   07/29/2023    Order Specific Question:   Reason for Exam (SYMPTOM  OR DIAGNOSIS REQUIRED)    Answer:   right shoulder pain    Order Specific Question:   Preferred imaging location?    Answer:   [name]    Order Specific Question:   Is patient pregnant?    Answer:   No   Ambulatory referral to Physical Therapy    Referral Priority:   Routine    Referral Type:   Physical Medicine    Referral Reason:   Specialty Services Required    Requested Specialty:   Physical Therapy    Number of Visits Requested:   1   Meds ordered this encounter  Medications   gabapentin (NEURONTIN) 100 MG capsule    Sig: Take 1-3 capsules (100-300 mg total) by mouth at bedtime as needed (nerve pain).    Dispense:  90 capsule    Refill:  3     Discussed warning signs or symptoms. Please see discharge instructions. Patient expresses understanding.   The above documentation has been reviewed and is accurate and complete [name], M.D.

## 2022-07-30 ENCOUNTER — Other Ambulatory Visit: Payer: Self-pay | Admitting: Family

## 2022-07-30 DIAGNOSIS — I10 Essential (primary) hypertension: Secondary | ICD-10-CM

## 2022-08-01 ENCOUNTER — Ambulatory Visit (INDEPENDENT_AMBULATORY_CARE_PROVIDER_SITE_OTHER): Payer: Managed Care, Other (non HMO) | Admitting: Family

## 2022-08-01 ENCOUNTER — Encounter: Payer: Self-pay | Admitting: Family

## 2022-08-01 VITALS — BP 130/88 | HR 94 | Temp 98.0°F | Ht 66.0 in | Wt 158.2 lb

## 2022-08-01 DIAGNOSIS — F32A Depression, unspecified: Secondary | ICD-10-CM

## 2022-08-01 DIAGNOSIS — I10 Essential (primary) hypertension: Secondary | ICD-10-CM

## 2022-08-01 DIAGNOSIS — F419 Anxiety disorder, unspecified: Secondary | ICD-10-CM | POA: Diagnosis not present

## 2022-08-01 MED ORDER — OLMESARTAN MEDOXOMIL-HCTZ 20-12.5 MG PO TABS: 1.0000 | ORAL_TABLET | Freq: Every day | ORAL | 1 refills | Status: DC

## 2022-08-01 NOTE — Progress Notes (Signed)
Right shoulder x-ray looks normal to radiology

## 2022-08-01 NOTE — Assessment & Plan Note (Signed)
chronic - started in her 78s, went into remission, and has returned given Trintellix 5mg  samples for 2 weeks, pt reports no nausea but had drowsiness no med for 2 weeks as insurance on hold - resent PA today and says it is approved advised to restart - sample for 7d provided, switch to qhs, she will send me a message if still drowsy during the day f/u in 3 months

## 2022-08-01 NOTE — Progress Notes (Signed)
Patient ID: Laura Santana, female    DOB: [birth year], 56 y.o.   MRN: [id number]  Chief Complaint  Patient presents with   Hypertension   Anxiety    HPI: Anxiety & depression:  pt took Wellbutrin in past for smoking, but had SE w/heartburn & Paxil caused her to feel numb. Taking care of oldest granddtr. - then son had MVA in August & in the hosp for a month, now home on feeding tube, requires care, pt also trying to work full time, husband helps some, but also working.   Hypertension: Patient is currently maintained on the following medications for blood pressure: Olmesartan-HCTZ. Patient reports poor compliance with blood pressure medications - none since first of year. Patient denies chest pain, shortness of breath or swelling, but pt does reports frequent headaches.  Last 3 blood pressure readings in our office are as follows: BP Readings from Last 3 Encounters:  08/01/22 130/88  07/28/22 (!) 148/84  07/05/22 (!) 152/96   Assessment & Plan:   Problem List Items Addressed This Visit       Cardiovascular and Mediastinum   Essential hypertension - Primary    chronic HCTZ was taken off as thought to be cause of her leg pain, started on Norvasc but caused swelling (w/o HCTZ) started Olmesartan-HCTZ 20-12.5mg  qam, BP better today, checking often at work  f/u in 3 months      Relevant Medications   olmesartan-hydrochlorothiazide (BENICAR HCT) 20-12.5 MG tablet     Other   Anxiety and depression    chronic - started in her 32s, went into remission, and has returned given Trintellix 5mg  samples for 2 weeks, pt reports no nausea but had drowsiness no med for 2 weeks as insurance on hold - resent PA today and says it is approved advised to restart - sample for 7d provided, switch to qhs, she will send me a message if still drowsy during the day f/u in 3 months      Subjective:    Outpatient Medications Prior to Visit  Medication Sig Dispense Refill   albuterol  (VENTOLIN HFA) 108 (90 Base) MCG/ACT inhaler TAKE 2 PUFFS BY MOUTH EVERY 6 HOURS AS NEEDED FOR WHEEZE OR SHORTNESS OF BREATH 18 g 3   amLODipine (NORVASC) 5 MG tablet Take 1 tablet (5 mg total) by mouth daily. 90 tablet 1   gabapentin (NEURONTIN) 100 MG capsule Take 1-3 capsules (100-300 mg total) by mouth at bedtime as needed (nerve pain). 90 capsule 3   vortioxetine HBr (TRINTELLIX) 5 MG TABS tablet Take 1 tablet (5 mg total) by mouth daily. 30 tablet 0   olmesartan-hydrochlorothiazide (BENICAR HCT) 20-12.5 MG tablet Take 1 tablet by mouth daily. 30 tablet 1   No facility-administered medications prior to visit.   Past Medical History:  Diagnosis Date   Asthma    Chronic tension-type headache, not intractable 11/29/2017   Diabetes mellitus without complication (HCC)    Elevated BP without diagnosis of hypertension 11/29/2017   History of chickenpox    History of prediabetes    Hyperlipidemia    Hypertension    Tinea pedis of both feet 11/29/2017   Past Surgical History:  Procedure Laterality Date   CHOLECYSTECTOMY     TUBAL LIGATION     Allergies  Allergen Reactions   Omeprazole    Penicillin V    Tetracycline    Penicillins Rash      Objective:    Physical Exam Vitals and nursing note reviewed.  Constitutional:      Appearance: Normal appearance.  Cardiovascular:     Rate and Rhythm: Normal rate and regular rhythm.  Pulmonary:     Effort: Pulmonary effort is normal.     Breath sounds: Normal breath sounds.  Musculoskeletal:        General: Normal range of motion.     Right shoulder: No swelling. Normal range of motion. Normal strength.  Skin:    General: Skin is warm and dry.  Neurological:     Mental Status: She is alert.  Psychiatric:        Mood and Affect: Mood is depressed. Affect is tearful.        Behavior: Behavior normal.    BP 130/88 (BP Location: Left Arm, Patient Position: Sitting, Cuff Size: Large)   Pulse 94   Temp 98 F (36.7 C) (Temporal)    Ht 5\' 6"  (1.676 m)   Wt 158 lb 4 oz (71.8 kg)   LMP 07/27/2022 (Approximate)   SpO2 99%   BMI 25.54 kg/m  Wt Readings from Last 3 Encounters:  08/01/22 158 lb 4 oz (71.8 kg)  07/28/22 159 lb (72.1 kg)  07/05/22 156 lb (70.8 kg)      07/07/22, NP

## 2022-08-01 NOTE — Assessment & Plan Note (Signed)
chronic HCTZ was taken off as thought to be cause of her leg pain, started on Norvasc but caused swelling (w/o HCTZ) started Olmesartan-HCTZ 20-12.5mg  qam, BP better today, checking often at work  f/u in 3 months

## 2022-08-08 ENCOUNTER — Ambulatory Visit: Payer: Managed Care, Other (non HMO) | Admitting: Family

## 2022-08-10 NOTE — Therapy (Unsigned)
OUTPATIENT PHYSICAL THERAPY SHOULDER EVALUATION   Patient Name: Laura Santana MRN: [id number] DOB:09-14-1965, 56 y.o., female Today's Date: 08/11/2022  END OF SESSION:  PT End of Session - 08/11/22 1010     Visit Number 1    Number of Visits 12    Date for PT Re-Evaluation 11/03/22    Authorization Type cigna - no VL    PT Start Time 1016    PT Stop Time 1104    PT Time Calculation (min) 48 min    Activity Tolerance Patient limited by pain    Behavior During Therapy [facility] for tasks assessed/performed             Past Medical History:  Diagnosis Date   Asthma    Chronic tension-type headache, not intractable 11/29/2017   Diabetes mellitus without complication (HCC)    Elevated BP without diagnosis of hypertension 11/29/2017   History of chickenpox    History of prediabetes    Hyperlipidemia    Hypertension    Tinea pedis of both feet 11/29/2017   Past Surgical History:  Procedure Laterality Date   CHOLECYSTECTOMY     TUBAL LIGATION     Patient Active Problem List   Diagnosis Date Noted   Anxiety and depression 07/05/2022   Chronic right shoulder pain 07/05/2022   Essential hypertension 07/05/2022   Cigarette nicotine dependence without complication 08/22/2018   Diabetes mellitus (HCC) 11/29/2017    PCP: Dulce Sellar, NP  REFERRING PROVIDER: Rodolph Bong, MD  REFERRING DIAG: M25.511 (ICD-10-CM) - Right shoulder pain, unspecified chronicity  THERAPY DIAG:  Chronic right shoulder pain - Plan: PT plan of care cert/re-cert  Muscle weakness (generalized) - Plan: PT plan of care cert/re-cert  Rationale for Evaluation and Treatment: Rehabilitation  ONSET DATE: March/April 2023  SUBJECTIVE:                                                                                                                                                                                      SUBJECTIVE STATEMENT: Patient reports she has a history of right  shoulder pain.  Last year she had intense right shoulder pain, went on steroids and this cleared up her shoulder pain.  Reports earlier this year about March, pain returned and she once again went on steroids but the pain did not resolve.  She kept wanting to get it looked at but had a lot of family events occur where she was assisting/taking care of other family members and unable to take care of herself.  It got to the point where she would wake up crying in pain because of her right shoulder and ultimately saw Dr.  Denyse Amass.  She received an injection in her right shoulder which has helped a lot, but she continues to have pain in her shoulder and numbness in her right fingers.  She ordered a splint/brace for her right hand to wear at night but has not received it yet.  Reports her shoulder feels like it just needs to pop and it feels like it keeps catching with certain motions.  She would like to regain motion and function of her right upper extremity as this is her dominant arm.  Patient works at [employer] and there are parts of her job that she cannot do because of the pain she has in her right arm.  She also reports weakness in her hands and history of dropping things like her pens.   PERTINENT HISTORY: pre-DB, HTN,   PAIN:  Are you having pain? Yes: NPRS scale: No number provided today/10 Pain location: right shoulder front Pain description: Numb, tight, catching Aggravating factors: laying on it, reaching behind her back  Relieving factors: medication, rest, topicals  PRECAUTIONS: None  WEIGHT BEARING RESTRICTIONS: No  FALLS:  Has patient fallen in last 6 months? No  LIVING ENVIRONMENT: Lives with: lives with their family   OCCUPATION: Full time at [employer]  PLOF: Independent  PATIENT GOALS:more range of motion and less limitations.   NEXT MD VISIT:   OBJECTIVE:    COGNITION: Overall cognitive status: Within functional limits for tasks  assessed       POSTURE: Slumped posture, rounded forward shoulders, forward head, sacral sitting   Neck A/PROM: WNL - no change in symptoms     UE Measurements Upper Extremity Right 08/11/2022 Left 08/11/2022   A/PROM MMT A/PROM MMT  Shoulder Flexion 140* 3+* 170 4+  Shoulder Extension      Shoulder Abduction 100* 3+* WFL 4  Shoulder Adduction      Shoulder Internal Rotation Reaches to right iliac crest* 3+* Reaches to T10 SP 4  Shoulder External Rotation Reaches to T4 SP* 3+* Reaches to T4 SP  4  Elbow Flexion WNL  WNL   Elbow Extension WNL  WNL   Wrist Flexion      Wrist Extension      Wrist Supination      Wrist Pronation      Wrist Ulnar Deviation      Wrist Radial Deviation      Grip Strength NA  NA     (Blank rows = not tested)   * pain    JOINT MOBILITY TESTING:  Hypermobility in right shoulder joint Special tests:  Negative Spurling's test  PALPATION:  Tenderness to palpation along right UT, pec    TODAY'S TREATMENT:                                                                                                                                         DATE:  08/11/2022  Therapeutic Exercise:  Aerobic: Supine: serratus reach 3x5 5" holds, neck ROT x15 B - focus on relaxing shoulder Prone:  Seated:  Standing: Neuromuscular Re-education: posture - sitting on sit bones not on tail bone, sleeping/supine posture with support under right arm to reduce stress on right shoulder, body scan in different positions  Manual Therapy: Therapeutic Activity: Self Care: Trigger Point Dry Needling:  Modalities:    PATIENT EDUCATION:  Education details: on current presentation, on HEP, on clinical outcomes score and POC, on posture, on shoulder mechanics Person educated: Patient Education method: Explanation, Demonstration, and Handouts Education comprehension: verbalized understanding   HOME EXERCISE PROGRAM: [id number]  ASSESSMENT:  CLINICAL  IMPRESSION: Patient complains of right shoulder pain that started earlier this year and numbness in right hand.  Patient presents with postural limitations, pain, and increased resting tone that is likely contributing to current condition and limiting functional strength and range of motion of right upper extremity.  Patient tolerated session well and would greatly benefit from skilled physical therapy at this time to improve ability to use dominant right upper extremity and return her to optimal function.  OBJECTIVE IMPAIRMENTS: decreased activity tolerance, decreased ROM, decreased strength, impaired UE functional use, improper body mechanics, postural dysfunction, and pain.   ACTIVITY LIMITATIONS: carrying, lifting, sleeping, dressing, and reach over head  PARTICIPATION LIMITATIONS: cleaning and occupation  PERSONAL FACTORS: Time since onset of injury/illness/exacerbation and family stressors  are also affecting patient's functional outcome.   REHAB POTENTIAL: Good  CLINICAL DECISION MAKING: Stable/uncomplicated  EVALUATION COMPLEXITY: Low   GOALS: Goals reviewed with patient? yes  SHORT TERM GOALS: Target date: 09/22/2022  Patient will be independent in self management strategies to improve quality of life and functional outcomes. Baseline: New Program Goal status: INITIAL  2.  Patient will report at least 50% improvement in overall symptoms and/or function to demonstrate improved functional mobility Baseline: 0% better Goal status: INITIAL  3.  Patient will be able to demonstrate at least 140 degrees of right shoulder flexion and abduction to improve ability to reach overhead Baseline: Unable Goal status: INITIAL     LONG TERM GOALS: Target date: 11/03/2022   Patient will report at least 75% improvement in overall symptoms and/or function to demonstrate improved functional mobility. Baseline: 0% better Goal status: INITIAL  2.  Patient will be able to reach to base of  right scapula with right upper extremity to improve ability to don/doff bra. Baseline: Unable Goal status: INITIAL  3.  Patient will demonstrate at least 4 out of 5 manual muscle testing strength in bilateral upper extremities to demonstrate improved ability to use right arm Baseline: See above Goal status: INITIAL  4.  Patient will demonstrate pain-free right shoulder motion in all directions to improve gross motor abilities of right upper extremity Baseline: Painful Goal status: INITIAL   PLAN:  PT FREQUENCY: 1-2x/week for a total of 12 visits over 12 week certification period  PT DURATION: 12 weeks  PLANNED INTERVENTIONS: Therapeutic exercises, Therapeutic activity, Neuromuscular re-education, Balance training, Gait training, Patient/Family education, Self Care, Joint mobilization, Joint manipulation, Vestibular training, Canalith repositioning, Orthotic/Fit training, Aquatic Therapy, Dry Needling, Electrical stimulation, Spinal manipulation, Spinal mobilization, Cryotherapy, Moist heat, Taping, Traction, Ultrasound, Ionotophoresis 4mg /ml Dexamethasone, Manual therapy, and Re-evaluation  PLAN FOR NEXT SESSION: posture, STM, cervical traction, ROM, isometrics   11:43 AM, 08/11/22 08/13/22, DPT Physical Therapy with [facility]

## 2022-08-11 ENCOUNTER — Ambulatory Visit: Payer: Managed Care, Other (non HMO) | Admitting: Physical Therapy

## 2022-08-11 ENCOUNTER — Ambulatory Visit (INDEPENDENT_AMBULATORY_CARE_PROVIDER_SITE_OTHER): Payer: Managed Care, Other (non HMO) | Admitting: Physical Therapy

## 2022-08-11 ENCOUNTER — Encounter: Payer: Self-pay | Admitting: Physical Therapy

## 2022-08-11 DIAGNOSIS — M6281 Muscle weakness (generalized): Secondary | ICD-10-CM

## 2022-08-11 DIAGNOSIS — M25511 Pain in right shoulder: Secondary | ICD-10-CM | POA: Diagnosis not present

## 2022-08-11 DIAGNOSIS — G8929 Other chronic pain: Secondary | ICD-10-CM

## 2022-08-11 NOTE — Therapy (Deleted)
OUTPATIENT PHYSICAL THERAPY TREATMENT NOTE   Patient Name: Laura Santana MRN: [id number] DOB:11/14/1965, 56 y.o., female Today's Date: 08/11/2022  PCP: Dulce Sellar, NP  REFERRING PROVIDER: Rodolph Bong, MD  END OF SESSION:    Past Medical History:  Diagnosis Date   Asthma    Chronic tension-type headache, not intractable 11/29/2017   Diabetes mellitus without complication (HCC)    Elevated BP without diagnosis of hypertension 11/29/2017   History of chickenpox    History of prediabetes    Hyperlipidemia    Hypertension    Tinea pedis of both feet 11/29/2017   Past Surgical History:  Procedure Laterality Date   CHOLECYSTECTOMY     TUBAL LIGATION     Patient Active Problem List   Diagnosis Date Noted   Anxiety and depression 07/05/2022   Chronic right shoulder pain 07/05/2022   Essential hypertension 07/05/2022   Cigarette nicotine dependence without complication 08/22/2018   Diabetes mellitus (HCC) 11/29/2017    THERAPY DIAG:  No diagnosis found.  REFERRING DIAG: M25.511 (ICD-10-CM) - Right shoulder pain, unspecified chronicity  Rationale for Evaluation and Treatment: Rehabilitation  ONSET DATE: March/April 2023  SUBJECTIVE:                                                                                                                                                                                      SUBJECTIVE STATEMENT: 08/11/2022 ***  Eval: Patient reports she has a history of right shoulder pain.  Last year she had intense right shoulder pain, went on steroids and this cleared up her shoulder pain.  Reports earlier this year about March, pain returned and she once again went on steroids but the pain did not resolve.  She kept wanting to get it looked at but had a lot of family events occur where she was assisting/taking care of other family members and unable to take care of herself.  It got to the point where she would wake up crying  in pain because of her right shoulder and ultimately saw Dr. Denyse Amass.  She received an injection in her right shoulder which has helped a lot, but she continues to have pain in her shoulder and numbness in her right fingers.  She ordered a splint/brace for her right hand to wear at night but has not received it yet.  Reports her shoulder feels like it just needs to pop and it feels like it keeps catching with certain motions.  She would like to regain motion and function of her right upper extremity as this is her dominant arm.  Patient works at [employer] and there are parts of her job that she  cannot do because of the pain she has in her right arm.  She also reports weakness in her hands and history of dropping things like her pens.   PERTINENT HISTORY: pre-DB, HTN,   PAIN:  Are you having pain? Yes: NPRS scale: No number provided today/10 Pain location: right shoulder front Pain description: Numb, tight, catching Aggravating factors: laying on it, reaching behind her back  Relieving factors: medication, rest, topicals  PRECAUTIONS: None  WEIGHT BEARING RESTRICTIONS: No  FALLS:  Has patient fallen in last 6 months? No  LIVING ENVIRONMENT: Lives with: lives with their family   OCCUPATION: Full time at [employer]  PLOF: Independent  PATIENT GOALS:more range of motion and less limitations.   NEXT MD VISIT:   OBJECTIVE:    COGNITION: Overall cognitive status: Within functional limits for tasks assessed       POSTURE: Slumped posture, rounded forward shoulders, forward head, sacral sitting   Neck A/PROM: WNL - no change in symptoms     UE Measurements Upper Extremity Right 08/11/2022 Left 08/11/2022   A/PROM MMT A/PROM MMT  Shoulder Flexion 140* 3+* 170 4+  Shoulder Extension      Shoulder Abduction 100* 3+* WFL 4  Shoulder Adduction      Shoulder Internal Rotation Reaches to right iliac crest* 3+* Reaches to T10 SP 4  Shoulder External Rotation  Reaches to T4 SP* 3+* Reaches to T4 SP  4  Elbow Flexion WNL  WNL   Elbow Extension WNL  WNL   Wrist Flexion      Wrist Extension      Wrist Supination      Wrist Pronation      Wrist Ulnar Deviation      Wrist Radial Deviation      Grip Strength NA  NA     (Blank rows = not tested)   * pain    JOINT MOBILITY TESTING:  Hypermobility in right shoulder joint Special tests:  Negative Spurling's test  PALPATION:  Tenderness to palpation along right UT, pec    TODAY'S TREATMENT:                                                                                                                                         DATE:  08/11/2022   Therapeutic Exercise:  Aerobic: Supine: serratus reach 3x5 5" holds, neck ROT x15 B - focus on relaxing shoulder Prone:  Seated:  Standing: Neuromuscular Re-education: posture - sitting on sit bones not on tail bone, sleeping/supine posture with support under right arm to reduce stress on right shoulder, body scan in different positions  Manual Therapy: Therapeutic Activity: Self Care: Trigger Point Dry Needling:  Modalities:    PATIENT EDUCATION:  Education details: on HEP Person educated: Patient Education method: Explanation, Facilities manager, and Handouts Education comprehension: verbalized understanding   HOME EXERCISE PROGRAM: [id number]  ASSESSMENT:  CLINICAL IMPRESSION: 08/11/2022 ***  Eval: Patient complains of right shoulder pain that started earlier this year and numbness in right hand.  Patient presents with postural limitations, pain, and increased resting tone that is likely contributing to current condition and limiting functional strength and range of motion of right upper extremity.  Patient tolerated session well and would greatly benefit from skilled physical therapy at this time to improve ability to use dominant right upper extremity and return her to optimal function.  OBJECTIVE IMPAIRMENTS: decreased activity  tolerance, decreased ROM, decreased strength, impaired UE functional use, improper body mechanics, postural dysfunction, and pain.   ACTIVITY LIMITATIONS: carrying, lifting, sleeping, dressing, and reach over head  PARTICIPATION LIMITATIONS: cleaning and occupation  PERSONAL FACTORS: Time since onset of injury/illness/exacerbation and family stressors are also affecting patient's functional outcome.   REHAB POTENTIAL: Good  CLINICAL DECISION MAKING: Stable/uncomplicated  EVALUATION COMPLEXITY: Low   GOALS: Goals reviewed with patient? yes  SHORT TERM GOALS: Target date: 09/22/2022  Patient will be independent in self management strategies to improve quality of life and functional outcomes. Baseline: New Program Goal status: INITIAL  2.  Patient will report at least 50% improvement in overall symptoms and/or function to demonstrate improved functional mobility Baseline: 0% better Goal status: INITIAL  3.  Patient will be able to demonstrate at least 140 degrees of right shoulder flexion and abduction to improve ability to reach overhead Baseline: Unable Goal status: INITIAL     LONG TERM GOALS: Target date: 11/03/2022   Patient will report at least 75% improvement in overall symptoms and/or function to demonstrate improved functional mobility. Baseline: 0% better Goal status: INITIAL  2.  Patient will be able to reach to base of right scapula with right upper extremity to improve ability to don/doff bra. Baseline: Unable Goal status: INITIAL  3.  Patient will demonstrate at least 4 out of 5 manual muscle testing strength in bilateral upper extremities to demonstrate improved ability to use right arm Baseline: See above Goal status: INITIAL  4.  Patient will demonstrate pain-free right shoulder motion in all directions to improve gross motor abilities of right upper extremity Baseline: Painful Goal status: INITIAL   PLAN:  PT FREQUENCY: 1-2x/week for a total of 12  visits over 12 week certification period  PT DURATION: 12 weeks  PLANNED INTERVENTIONS: Therapeutic exercises, Therapeutic activity, Neuromuscular re-education, Balance training, Gait training, Patient/Family education, Self Care, Joint mobilization, Joint manipulation, Vestibular training, Canalith repositioning, Orthotic/Fit training, Aquatic Therapy, Dry Needling, Electrical stimulation, Spinal manipulation, Spinal mobilization, Cryotherapy, Moist heat, Taping, Traction, Ultrasound, Ionotophoresis 4mg /ml Dexamethasone, Manual therapy, and Re-evaluation  PLAN FOR NEXT SESSION: posture, STM, cervical traction, ROM, isometrics   11:28 AM, 08/11/22 08/13/22, DPT Physical Therapy with [facility]

## 2022-08-25 ENCOUNTER — Encounter: Payer: Managed Care, Other (non HMO) | Admitting: Physical Therapy

## 2022-08-29 ENCOUNTER — Encounter: Payer: Self-pay | Admitting: Physical Therapy

## 2022-08-29 ENCOUNTER — Ambulatory Visit (INDEPENDENT_AMBULATORY_CARE_PROVIDER_SITE_OTHER): Payer: Managed Care, Other (non HMO) | Admitting: Physical Therapy

## 2022-08-29 DIAGNOSIS — M6281 Muscle weakness (generalized): Secondary | ICD-10-CM

## 2022-08-29 DIAGNOSIS — G8929 Other chronic pain: Secondary | ICD-10-CM

## 2022-08-29 DIAGNOSIS — M25511 Pain in right shoulder: Secondary | ICD-10-CM

## 2022-08-29 NOTE — Therapy (Addendum)
OUTPATIENT PHYSICAL THERAPY TREATMENT NOTE PHYSICAL THERAPY DISCHARGE SUMMARY  Visits from Start of Care: 2  Current functional level related to goals / functional outcomes: Unable to assess due to unplanned discharge    Remaining deficits: Unable to assess due to unplanned discharge    Education / Equipment: Unable to assess due to unplanned discharge    Patient agrees to discharge. Patient goals were not met. Patient is being discharged due to not returning since the last visit.  2:00 PM, 11/10/22 Laura Santana, DPT Physical Therapy with [facility]   Patient Name: Laura Santana MRN: [id number] DOB:1966-05-07, 56 y.o., female Today's Date: 08/29/2022  PCP: Jeanie Sewer, NP  REFERRING PROVIDER: Gregor Hams, MD  END OF SESSION:   PT End of Session - 08/29/22 1015     Visit Number 2    Number of Visits 12    Date for PT Re-Evaluation 11/03/22    Authorization Type cigna - no VL    PT Start Time 1016    PT Stop Time 1057    PT Time Calculation (min) 41 min    Activity Tolerance Patient limited by pain    Behavior During Therapy [facility] for tasks assessed/performed             Past Medical History:  Diagnosis Date   Asthma    Chronic tension-type headache, not intractable 11/29/2017   Diabetes mellitus without complication ([place])    Elevated BP without diagnosis of hypertension 11/29/2017   History of chickenpox    History of prediabetes    Hyperlipidemia    Hypertension    Tinea pedis of both feet 11/29/2017   Past Surgical History:  Procedure Laterality Date   CHOLECYSTECTOMY     TUBAL LIGATION     Patient Active Problem List   Diagnosis Date Noted   Anxiety and depression 07/05/2022   Chronic right shoulder pain 07/05/2022   Essential hypertension 07/05/2022   Cigarette nicotine dependence without complication [id number]   Diabetes mellitus ([place]) 11/29/2017    THERAPY DIAG:  Chronic right shoulder pain  Muscle weakness  (generalized)  REFERRING DIAG: M25.511 (ICD-10-CM) - Right shoulder pain, unspecified chronicity  Rationale for Evaluation and Treatment: Rehabilitation  ONSET DATE: March/April 2023  SUBJECTIVE:                                                                                                                                                                                      SUBJECTIVE STATEMENT: 08/29/2022 States she has been busy with work and her family. States she has been working 12 hour shifts and has not been able to do the exercises. States she has been  trying to reach a little bit more but it is limited. States she is sleep a little better.   Eval: Patient reports she has a history of right shoulder pain.  Last year she had intense right shoulder pain, went on steroids and this cleared up her shoulder pain.  Reports earlier this year about March, pain returned and she once again went on steroids but the pain did not resolve.  She kept wanting to get it looked at but had a lot of family events occur where she was assisting/taking care of other family members and unable to take care of herself.  It got to the point where she would wake up crying in pain because of her right shoulder and ultimately saw Dr. [name].  She received an injection in her right shoulder which has helped a lot, but she continues to have pain in her shoulder and numbness in her right fingers.  She ordered a splint/brace for her right hand to wear at night but has not received it yet.  Reports her shoulder feels like it just needs to pop and it feels like it keeps catching with certain motions.  She would like to regain motion and function of her right upper extremity as this is her dominant arm.  Patient works at [employer] and there are parts of her job that she cannot do because of the pain she has in her right arm.  She also reports weakness in her hands and history of dropping things like her  pens.   PERTINENT HISTORY: pre-DB, HTN,   PAIN:  Are you having pain? Yes: NPRS scale: 3/10 Pain location: right shoulder front Pain description: Numb, tight, catching Aggravating factors: laying on it, reaching behind her back  Relieving factors: medication, rest, topicals  PRECAUTIONS: None  WEIGHT BEARING RESTRICTIONS: No  FALLS:  Has patient fallen in last 6 months? No  LIVING ENVIRONMENT: Lives with: lives with their family   OCCUPATION: Full time at [employer]  PLOF: Independent  PATIENT GOALS:more range of motion and less limitations.   NEXT MD VISIT:   OBJECTIVE:    COGNITION: Overall cognitive status: Within functional limits for tasks assessed       POSTURE: Slumped posture, rounded forward shoulders, forward head, sacral sitting   Neck A/PROM: WNL - no change in symptoms     UE Measurements Upper Extremity Right 08/11/2022 Left 08/11/2022   A/PROM MMT A/PROM MMT  Shoulder Flexion 140* 3+* 170 4+  Shoulder Extension      Shoulder Abduction 100* 3+* WFL 4  Shoulder Adduction      Shoulder Internal Rotation Reaches to right iliac crest* 3+* Reaches to T10 SP 4  Shoulder External Rotation Reaches to T4 SP* 3+* Reaches to T4 SP  4  Elbow Flexion WNL  WNL   Elbow Extension WNL  WNL   Wrist Flexion      Wrist Extension      Wrist Supination      Wrist Pronation      Wrist Ulnar Deviation      Wrist Radial Deviation      Grip Strength NA  NA     (Blank rows = not tested)   * pain    JOINT MOBILITY TESTING:  Hypermobility in right shoulder joint Special tests:  Negative Spurling's test  PALPATION:  Tenderness to palpation along right UT, pec    TODAY'S TREATMENT:  DATE:  08/29/2022   Therapeutic Exercise:  Aerobic: Supine:   Prone:  Seated:  Standing: Neuromuscular Re-education: body  scan ands different breathing mechanics - box breathing - too difficult, long exhale 10 minutes, belly expansion then belly breath 15 minutes Manual Therapy: Therapeutic Activity: Self Care: Trigger Point Dry Needling:  Modalities: thermo therapy during supine exercises to cervical spine and right shoulder    PATIENT EDUCATION:  Education details: on HEP Person educated: Patient Education method: Explanation, Media planner, and Handouts Education comprehension: verbalized understanding   HOME EXERCISE PROGRAM: [place]  ASSESSMENT:  CLINICAL IMPRESSION: 08/29/2022 Focused on identifying pain management strategies patient could implement throughout the day. Tolerated breathing exercises well and educated patient in rationale behind them. Reduced pain noted end of session and improved muscle relaxation.   Eval: Patient complains of right shoulder pain that started earlier this year and numbness in right hand.  Patient presents with postural limitations, pain, and increased resting tone that is likely contributing to current condition and limiting functional strength and range of motion of right upper extremity.  Patient tolerated session well and would greatly benefit from skilled physical therapy at this time to improve ability to use dominant right upper extremity and return her to optimal function.  OBJECTIVE IMPAIRMENTS: decreased activity tolerance, decreased ROM, decreased strength, impaired UE functional use, improper body mechanics, postural dysfunction, and pain.   ACTIVITY LIMITATIONS: carrying, lifting, sleeping, dressing, and reach over head  PARTICIPATION LIMITATIONS: cleaning and occupation  PERSONAL FACTORS: Time since onset of injury/illness/exacerbation and family stressors are also affecting patient's functional outcome.   REHAB POTENTIAL: Good  CLINICAL DECISION MAKING: Stable/uncomplicated  EVALUATION COMPLEXITY: Low   GOALS: Goals reviewed with patient?  yes  SHORT TERM GOALS: Target date: 09/22/2022  Patient will be independent in self management strategies to improve quality of life and functional outcomes. Baseline: New Program Goal status: INITIAL  2.  Patient will report at least 50% improvement in overall symptoms and/or function to demonstrate improved functional mobility Baseline: 0% better Goal status: INITIAL  3.  Patient will be able to demonstrate at least 140 degrees of right shoulder flexion and abduction to improve ability to reach overhead Baseline: Unable Goal status: INITIAL     LONG TERM GOALS: Target date: 11/03/2022   Patient will report at least 75% improvement in overall symptoms and/or function to demonstrate improved functional mobility. Baseline: 0% better Goal status: INITIAL  2.  Patient will be able to reach to base of right scapula with right upper extremity to improve ability to don/doff bra. Baseline: Unable Goal status: INITIAL  3.  Patient will demonstrate at least 4 out of 5 manual muscle testing strength in bilateral upper extremities to demonstrate improved ability to use right arm Baseline: See above Goal status: INITIAL  4.  Patient will demonstrate pain-free right shoulder motion in all directions to improve gross motor abilities of right upper extremity Baseline: Painful Goal status: INITIAL   PLAN:  PT FREQUENCY: 1-2x/week for a total of 12 visits over 12 week certification period  PT DURATION: 12 weeks  PLANNED INTERVENTIONS: Therapeutic exercises, Therapeutic activity, Neuromuscular re-education, Balance training, Gait training, Patient/Family education, Self Care, Joint mobilization, Joint manipulation, Vestibular training, Canalith repositioning, Orthotic/Fit training, Aquatic Therapy, Dry Needling, Electrical stimulation, Spinal manipulation, Spinal mobilization, Cryotherapy, Moist heat, Taping, Traction, Ultrasound, Ionotophoresis '4mg'$ /ml Dexamethasone, Manual therapy, and  Re-evaluation  PLAN FOR NEXT SESSION: posture, STM, cervical traction, ROM, isometrics   11:29 AM, 08/29/22 Laura Santana, DPT Physical Therapy with  [place] [facility]

## 2022-08-30 ENCOUNTER — Encounter: Payer: Managed Care, Other (non HMO) | Admitting: Physical Therapy

## 2022-09-06 ENCOUNTER — Encounter: Payer: Managed Care, Other (non HMO) | Admitting: Physical Therapy

## 2022-09-07 NOTE — Progress Notes (Deleted)
   I, [name], LAT, ATC acting as a scribe for [name], MD.  Laura Santana is a 56 y.o. female who presents to [facility] today for f/u R shoulder pain.  Was last seen by Dr. Denyse Amass on 07/28/2022 and was given a right glenohumeral steroid injection and was referred to PT, completing 2 visits.  Today, patient reports   Dx imaging: 07/28/22 R shoulder XR 10/01/20 R shoulder XR   Pertinent review of systems: ***  Relevant historical information: ***   Exam:  There were no vitals taken for this visit. General: Well Developed, well nourished, and in no acute distress.   MSK: ***    Lab and Radiology Results No results found for this or any previous visit (from the past 72 hour(s)). No results found.     Assessment and Plan: 56 y.o. female with ***   PDMP not reviewed this encounter. No orders of the defined types were placed in this encounter.  No orders of the defined types were placed in this encounter.    Discussed warning signs or symptoms. Please see discharge instructions. Patient expresses understanding.   ***

## 2022-09-08 ENCOUNTER — Ambulatory Visit: Payer: Managed Care, Other (non HMO) | Admitting: Family Medicine

## 2022-10-21 ENCOUNTER — Ambulatory Visit: Payer: Managed Care, Other (non HMO) | Admitting: Family

## 2022-10-31 ENCOUNTER — Ambulatory Visit: Payer: Managed Care, Other (non HMO) | Admitting: Family

## 2022-12-01 ENCOUNTER — Encounter: Payer: Self-pay | Admitting: Family

## 2022-12-01 ENCOUNTER — Ambulatory Visit: Payer: Managed Care, Other (non HMO) | Admitting: Family

## 2022-12-01 VITALS — BP 136/83 | HR 76 | Temp 96.0°F | Ht 66.0 in | Wt 164.0 lb

## 2022-12-01 DIAGNOSIS — I10 Essential (primary) hypertension: Secondary | ICD-10-CM | POA: Diagnosis not present

## 2022-12-01 DIAGNOSIS — F419 Anxiety disorder, unspecified: Secondary | ICD-10-CM | POA: Diagnosis not present

## 2022-12-01 DIAGNOSIS — E119 Type 2 diabetes mellitus without complications: Secondary | ICD-10-CM | POA: Diagnosis not present

## 2022-12-01 DIAGNOSIS — F32A Depression, unspecified: Secondary | ICD-10-CM | POA: Diagnosis not present

## 2022-12-01 LAB — COMPREHENSIVE METABOLIC PANEL
ALT: 13 U/L (ref 0–35)
AST: 13 U/L (ref 0–37)
Albumin: 4.2 g/dL (ref 3.5–5.2)
Alkaline Phosphatase: 65 U/L (ref 39–117)
BUN: 10 mg/dL (ref 6–23)
CO2: 31 mEq/L (ref 19–32)
Calcium: 9.7 mg/dL (ref 8.4–10.5)
Chloride: 102 mEq/L (ref 96–112)
Creatinine, Ser: 0.66 mg/dL (ref 0.40–1.20)
GFR: 99.44 mL/min (ref >60.00)
Glucose, Bld: 88 mg/dL (ref 70–99)
Potassium: 4.4 mEq/L (ref 3.5–5.1)
Sodium: 139 mEq/L (ref 135–145)
Total Bilirubin: 0.4 mg/dL (ref 0.2–1.2)
Total Protein: 7.1 g/dL (ref 6.0–8.3)

## 2022-12-01 LAB — LIPID PANEL
Cholesterol: 213 mg/dL — ABNORMAL HIGH (ref 0–200)
HDL: 55.4 mg/dL (ref >39.00)
LDL Cholesterol: 138 mg/dL — ABNORMAL HIGH (ref 0–99)
NonHDL: 157.31
Total CHOL/HDL Ratio: 4
Triglycerides: 99 mg/dL (ref 0.0–149.0)
VLDL: 19.8 mg/dL (ref 0.0–40.0)

## 2022-12-01 LAB — HEMOGLOBIN A1C: Hgb A1c MFr Bld: 6.5 % (ref 4.6–6.5)

## 2022-12-01 NOTE — Assessment & Plan Note (Signed)
chronic taking Olmesartan-HCTZ 20-12.5mg  qam BP good, checks sometimes at work sending refilling checking labs f/u in 3 months

## 2022-12-01 NOTE — Assessment & Plan Note (Signed)
chronic last A1C 6.4 a year ago was taking Metformin IR but had GI SE, Wilder Glade also had SE not taking any meds now, has been working on wt loss had a 14 pound weight loss with diet and exercise in past, but has regained wt, had been going to [facility] loss in [place] rechecking A1C today

## 2022-12-01 NOTE — Progress Notes (Signed)
Patient ID: Laura Santana, female    DOB: 1966-07-24, 57 y.o.   MRN: [id number]  Chief Complaint  Patient presents with   Hypertension   Anxiety   Depression   HPI: T2DM: Pt is currently maintained on the following medications for diabetes: Metformin IR & Farxiga (caused dizziness) in past, not currently. Failed meds include: Metformin IR Denies polyuria/polydipsia/polyphagia Denies hypoglycemia Home glucose readings range: not checking  Last A1C was  Lab Results  Component Value Date   HGBA1C 6.4 04/09/2021   HGBA1C 6.5 07/17/2020   HGBA1C 6.2 (H) 11/15/2019  Anxiety & depression:  pt took Wellbutrin in past for smoking, but had SE w/heartburn & Paxil caused her to feel numb. Taking care of oldest granddtr. - then son had MVA in August & in the hosp for a month, now home on feeding tube, requires care, pt also trying to work full time, husband helps some, but also working.   Hypertension: Patient is currently maintained on the following medications for blood pressure: Olmesartan-HCTZ. Patient reports poor compliance with blood pressure medications - none since first of year. Patient denies chest pain, shortness of breath or swelling, but pt does reports frequent headaches.   Assessment & Plan:  Essential hypertension Assessment & Plan: chronic taking Olmesartan-HCTZ 20-12.5mg  qam BP good, checks sometimes at work sending refilling checking labs f/u in 3 months  Orders: -     Lipid panel -     Comprehensive metabolic panel  Anxiety and depression Assessment & Plan: chronic  was taking Trintellix 5mg , taking qhs d/t drowsiness, ran out of her RX and states she has been irritable, moody sending refill today f/u in 6 months or prn   Type 2 diabetes mellitus without complication, without long-term current use of insulin (HCC) Assessment & Plan: chronic last A1C 6.4 a year ago was taking Metformin IR but had GI SE, Wilder Glade also had SE not taking any meds  now, has been working on wt loss had a 14 pound weight loss with diet and exercise in past, but has regained wt, had been going to [facility] loss in [place] rechecking A1C today  Orders: -     Hemoglobin A1c   Subjective:    Outpatient Medications Prior to Visit  Medication Sig Dispense Refill   albuterol (VENTOLIN HFA) 108 (90 Base) MCG/ACT inhaler TAKE 2 PUFFS BY MOUTH EVERY 6 HOURS AS NEEDED FOR WHEEZE OR SHORTNESS OF BREATH 18 g 3   gabapentin (NEURONTIN) 100 MG capsule Take 1-3 capsules (100-300 mg total) by mouth at bedtime as needed (nerve pain). 90 capsule 3   olmesartan-hydrochlorothiazide (BENICAR HCT) 20-12.5 MG tablet Take 1 tablet by mouth daily. 90 tablet 1   vortioxetine HBr (TRINTELLIX) 5 MG TABS tablet Take 1 tablet (5 mg total) by mouth daily. 30 tablet 0   amLODipine (NORVASC) 5 MG tablet Take 1 tablet (5 mg total) by mouth daily. 90 tablet 1   No facility-administered medications prior to visit.   Past Medical History:  Diagnosis Date   Asthma    Chronic tension-type headache, not intractable 11/29/2017   Diabetes mellitus without complication (HCC)    Elevated BP without diagnosis of hypertension 11/29/2017   History of chickenpox    History of prediabetes    Hyperlipidemia    Hypertension    Tinea pedis of both feet 11/29/2017   Past Surgical History:  Procedure Laterality Date   CHOLECYSTECTOMY     TUBAL LIGATION     Allergies  Allergen  Reactions   Omeprazole    Penicillin V    Tetracycline    Penicillins Rash      Objective:    Physical Exam Vitals and nursing note reviewed.  Constitutional:      Appearance: Normal appearance.  Cardiovascular:     Rate and Rhythm: Normal rate and regular rhythm.  Pulmonary:     Effort: Pulmonary effort is normal.     Breath sounds: Normal breath sounds.  Musculoskeletal:        General: Normal range of motion.  Skin:    General: Skin is warm and dry.  Neurological:     Mental Status: She is alert.   Psychiatric:        Mood and Affect: Mood normal.        Behavior: Behavior normal.    BP 136/83 (BP Location: Left Arm, Patient Position: Sitting, Cuff Size: Large)   Pulse 76   Temp (!) 96 F (35.6 C) (Temporal)   Ht 5\' 6"  (1.676 m)   Wt 164 lb (74.4 kg)   SpO2 99%   BMI 26.47 kg/m  Wt Readings from Last 3 Encounters:  12/01/22 164 lb (74.4 kg)  08/01/22 158 lb 4 oz (71.8 kg)  07/28/22 159 lb (72.1 kg)      Jeanie Sewer, NP

## 2022-12-01 NOTE — Assessment & Plan Note (Addendum)
chronic  was taking Trintellix 5mg , taking qhs d/t drowsiness, ran out of her RX and states she has been irritable, moody sending refill today f/u in 6 months or prn

## 2022-12-03 NOTE — Progress Notes (Signed)
Your glucose, electrolytes, liver & kidney function are all normal.  Your total cholesterol number & LDL (bad #) are too high and along with smoking & elevated A1C again (6.5) this puts you at a very high risk for a heart attack or stroke!  I would like you to start a low dose of Crestor, and I usually titer the dose slowly. Let me know if you are agreeable to starting. You also must reduce any fried foods, alcohol, nonnutritional snacks e.g. chips/cookies,pies, cakes and candies, fatty meat (red meat), high fat dairy foods:  including cheese, milk, ice cream.  Increase intake of whole fruits/vegetables/fiber.    Continue or restart an exercise routine, shooting for 47min 5-7days per week.   Recommend repeating fasting lipids in 3 months if you start the Crestor. Let me know!

## 2022-12-06 ENCOUNTER — Other Ambulatory Visit: Payer: Self-pay | Admitting: Family

## 2022-12-06 DIAGNOSIS — E1169 Type 2 diabetes mellitus with other specified complication: Secondary | ICD-10-CM

## 2022-12-06 MED ORDER — ROSUVASTATIN CALCIUM 5 MG PO TABS: 5.0000 mg | ORAL_TABLET | Freq: Every day | ORAL | 2 refills | Status: DC

## 2022-12-06 NOTE — Progress Notes (Signed)
She did not tolerate Metformin or Farxiga in past. We could prob get Mounjaro or Ozempic covered - is she ok with doing a weekly injectable? - these are for weight loss to lower A1C

## 2022-12-12 ENCOUNTER — Other Ambulatory Visit: Payer: Self-pay

## 2022-12-12 DIAGNOSIS — F32A Anxiety disorder, unspecified: Secondary | ICD-10-CM

## 2022-12-12 MED ORDER — VORTIOXETINE HBR 5 MG PO TABS: 5.0000 mg | ORAL_TABLET | Freq: Every day | ORAL | 0 refills | Status: DC

## 2022-12-12 MED ORDER — TIRZEPATIDE 2.5 MG/0.5ML ~~LOC~~ SOAJ: 2.5000 mg | SUBCUTANEOUS | 0 refills | Status: DC

## 2022-12-12 NOTE — Addendum Note (Signed)
Addended byJeanie Sewer on: 12/12/2022 07:45 AM   Modules accepted: Orders

## 2022-12-12 NOTE — Progress Notes (Signed)
Ok, I have sent in [place]. I only sent 1 month's worth = 4 pens. She has to let me know after 1 month if she wants to stay on the lowest dose or go up to next dose. Remind her that nausea is the biggest side effect. Don't go more than 2 hours without eating a small snack/meal.

## 2023-01-10 ENCOUNTER — Other Ambulatory Visit: Payer: Self-pay | Admitting: Family

## 2023-01-10 ENCOUNTER — Ambulatory Visit
Admission: RE | Admit: 2023-01-10 | Discharge: 2023-01-10 | Disposition: A | Discharge status: Home / Self Care | Payer: Managed Care, Other (non HMO) | Source: Ambulatory Visit | Attending: Family | Admitting: Family

## 2023-01-10 DIAGNOSIS — Z1231 Encounter for screening mammogram for malignant neoplasm of breast: Secondary | ICD-10-CM

## 2023-01-23 ENCOUNTER — Other Ambulatory Visit: Payer: Self-pay | Admitting: Family

## 2023-01-23 DIAGNOSIS — F419 Anxiety disorder, unspecified: Secondary | ICD-10-CM

## 2023-01-23 DIAGNOSIS — E119 Type 2 diabetes mellitus without complications: Secondary | ICD-10-CM

## 2023-01-23 MED ORDER — VORTIOXETINE HBR 5 MG PO TABS: 5.0000 mg | ORAL_TABLET | Freq: Every day | ORAL | 0 refills | Status: DC

## 2023-01-23 MED ORDER — TIRZEPATIDE 2.5 MG/0.5ML ~~LOC~~ SOAJ: 2.5000 mg | SUBCUTANEOUS | 0 refills | Status: DC

## 2023-02-13 ENCOUNTER — Other Ambulatory Visit: Payer: Self-pay | Admitting: Family

## 2023-02-13 DIAGNOSIS — I10 Essential (primary) hypertension: Secondary | ICD-10-CM

## 2023-02-15 ENCOUNTER — Ambulatory Visit: Payer: Managed Care, Other (non HMO) | Admitting: Family

## 2023-02-27 ENCOUNTER — Ambulatory Visit (INDEPENDENT_AMBULATORY_CARE_PROVIDER_SITE_OTHER): Payer: Managed Care, Other (non HMO) | Admitting: Family

## 2023-02-27 VITALS — BP 127/84 | HR 82 | Temp 97.8°F | Ht 66.0 in | Wt 168.6 lb

## 2023-02-27 DIAGNOSIS — F32A Depression, unspecified: Secondary | ICD-10-CM

## 2023-02-27 DIAGNOSIS — E119 Type 2 diabetes mellitus without complications: Secondary | ICD-10-CM

## 2023-02-27 DIAGNOSIS — F419 Anxiety disorder, unspecified: Secondary | ICD-10-CM | POA: Diagnosis not present

## 2023-02-27 DIAGNOSIS — I10 Essential (primary) hypertension: Secondary | ICD-10-CM | POA: Diagnosis not present

## 2023-02-27 DIAGNOSIS — E785 Hyperlipidemia, unspecified: Secondary | ICD-10-CM | POA: Insufficient documentation

## 2023-02-27 DIAGNOSIS — E1169 Type 2 diabetes mellitus with other specified complication: Secondary | ICD-10-CM | POA: Diagnosis not present

## 2023-02-27 DIAGNOSIS — Z7985 Long-term (current) use of injectable non-insulin antidiabetic drugs: Secondary | ICD-10-CM

## 2023-02-27 DIAGNOSIS — G8929 Other chronic pain: Secondary | ICD-10-CM

## 2023-02-27 DIAGNOSIS — F17210 Nicotine dependence, cigarettes, uncomplicated: Secondary | ICD-10-CM

## 2023-02-27 DIAGNOSIS — M25511 Pain in right shoulder: Secondary | ICD-10-CM

## 2023-02-27 MED ORDER — VARENICLINE TARTRATE 0.5 MG PO TABS
0.5000 mg | ORAL_TABLET | ORAL | 1 refills | Status: DC
Start: 2023-02-27 — End: 2023-04-21

## 2023-02-27 MED ORDER — VORTIOXETINE HBR 10 MG PO TABS
5.0000 mg | ORAL_TABLET | Freq: Every day | ORAL | 5 refills | Status: DC
Start: 2023-02-27 — End: 2023-07-05

## 2023-02-27 MED ORDER — MELOXICAM 7.5 MG PO TABS
15.0000 mg | ORAL_TABLET | Freq: Every day | ORAL | 0 refills | Status: DC
Start: 2023-02-27 — End: 2023-03-08

## 2023-02-27 MED ORDER — TIRZEPATIDE 5 MG/0.5ML ~~LOC~~ SOAJ
5.0000 mg | SUBCUTANEOUS | 1 refills | Status: DC
Start: 2023-02-27 — End: 2023-04-24

## 2023-02-27 MED ORDER — ROSUVASTATIN CALCIUM 5 MG PO TABS: 5.0000 mg | ORAL_TABLET | Freq: Every day | ORAL | 1 refills | Status: DC

## 2023-02-27 MED ORDER — OLMESARTAN MEDOXOMIL-HCTZ 20-12.5 MG PO TABS
1.0000 | ORAL_TABLET | Freq: Every day | ORAL | 1 refills | Status: DC
Start: 2023-02-27 — End: 2023-07-05

## 2023-02-27 NOTE — Assessment & Plan Note (Signed)
chronic hx of trying chantix - worked but caused a rash wellbutrin - increased agitation, and patches caused skin irritation advised on restarting chantix, but stay on lower dose if rash develops. OK to stop the med for a few days, then restart at lower dose. sending 0.5 mg generic Chantix, reminded pt on use & SE F/U 3 mos

## 2023-02-27 NOTE — Assessment & Plan Note (Signed)
2022 - pt reports seeing Dr Denyse Amass - given pred pack and PT ordered, reports prednisone helped first time, but not 2nd time. unable to lift arm even to shoulder  continue to advise on alternating heat & ice up to tid, can take 600mg  Ibuprofen tid after meals pt wants MRI to know what is exactly wrong sending low dose Mobic 7.5mg  every day & referring back to dr Denyse Amass

## 2023-02-27 NOTE — Assessment & Plan Note (Signed)
chronic TC 213, LDL 138 started on Crestor 5mg  every day, pt tolerating sending refill recheck lipids next visit 3 mos

## 2023-02-27 NOTE — Assessment & Plan Note (Signed)
chronic  taking Trintellix 5mg , she would like to increase dose as not at goal w/sx sending 10mg  refill today f/u in 3 months or prn

## 2023-02-27 NOTE — Assessment & Plan Note (Signed)
chronic last A1C 6.5 11/2022, added Mounjaro 2.5mg , approved, started 4 wks ago, denies SE, but has had wt gain sending higher dose today, 5mg , continue to advise on SE and prevention w/hydration, OTC stool softener if needed. F/U in 2-3 mos

## 2023-02-27 NOTE — Assessment & Plan Note (Signed)
chronic taking Olmesartan-HCTZ 20-12.5mg  qam BP good, checks sometimes at work sending refill labs due next visit f/u in 3 months

## 2023-02-27 NOTE — Progress Notes (Signed)
Patient ID: Laura Santana, female    DOB: [birth year], 57 y.o.   MRN: [id number]  Chief Complaint  Patient presents with   Shoulder Pain    Pt c/o Right shoulder pain for about a year. Has tried Physical therapy , injection, prednisone, gabapentin and hot compresses which does help pain, Unable to do certain ROM.    Hypertension   Diabetes   Nicotine Dependence   Hyperlipidemia    HPI: Hyperlipidemia: Patient is currently maintained on the following medication for hyperlipidemia: Crestor 5mg  qd. Patient denies myalgia or other side effects. Patient reports good compliance with low fat/low cholesterol diet.  Last lipid panel as follows: Lab Results  Component Value Date   CHOL 213 (H) 12/01/2022   HDL 55.40 12/01/2022   LDLCALC 138 (H) 12/01/2022   TRIG 99.0 12/01/2022   CHOLHDL 4 12/01/2022   Chronic right shoulder:  HX:  she first hurt it last year at work - on pred pack  which helped and pain went away for long time, recently took pred pack again but didn't work this time. She reports arm & hand coldness along with numbness in the hand at times. States it is an achy pain that starts at top and anterior of shoulder and runs down the arm, denies tingling.  Seen last by Dr Denyse Amass and had imaging done, PT, and steroid injection which she states helped very little. Wants to get an MRI to see exactly what is going on.  Nicotine addiction:  Does want to discuss smoking cessation again. Has been on chantix, wellbutrin, and patches before. Wants to try patches again but notes local skin reaction after removal. Did have success with smoking cessation in the past with these, though. Could not tolerate wellbutrin as it agitated her, chantix caused AE as well, skin rash, but she said it worked well to help her stop smoking, not sure when she developed a rash. T2DM: Pt is currently maintained on the following medications for diabetes: Metformin IR & Farxiga (caused dizziness) in past, not  currently, Mounjaro 2.5mg  started a month ago, tolerating, wt up 5lbs. Failed meds include: Metformin IR Denies polyuria/polydipsia/polyphagia Denies hypoglycemia Home glucose readings range: not checking  Last A1C was  Lab Results  Component Value Date   HGBA1C 6.5 12/01/2022   HGBA1C 6.4 04/09/2021   HGBA1C 6.5 07/17/2020   Anxiety & depression:  pt took Wellbutrin in past for smoking, but had SE w/heartburn & Paxil caused her to feel numb. Taking care of oldest granddtr. - then son had MVA in August & in the hosp for a month, now home on feeding tube, requires care, pt also trying to work full time, husband helps some, but also working. Taking trintellix 5mg  every day, no SE.   Hypertension: Patient is currently maintained on the following medications for blood pressure: Olmesartan-HCTZ. Patient reports poor compliance with blood pressure medications - none since first of year. Patient denies chest pain, shortness of breath or swelling, but pt had  frequent headaches in past, but not currently.  Assessment & Plan:  Type 2 diabetes mellitus without complication, without long-term current use of insulin (HCC) Assessment & Plan: chronic last A1C 6.5 11/2022, added Mounjaro 2.5mg , approved, started 4 wks ago, denies SE, but has had wt gain sending higher dose today, 5mg , continue to advise on SE and prevention w/hydration, OTC stool softener if needed. F/U in 2-3 mos  Orders: -     [facility] DIABETES FOOT EXAM -  Tirzepatide; Inject 5 mg into the skin once a week.  Dispense: 2 mL; Refill: 1 -     Ambulatory referral to Podiatry  Essential hypertension Assessment & Plan: chronic taking Olmesartan-HCTZ 20-12.5mg  qam BP good, checks sometimes at work sending refill labs due next visit f/u in 3 months  Orders: -     Olmesartan Medoxomil-HCTZ; Take 1 tablet by mouth daily.  Dispense: 90 tablet; Refill: 1  Anxiety and depression Assessment & Plan: chronic  taking Trintellix 5mg , she  would like to increase dose as not at goal w/sx sending 10mg  refill today f/u in 3 months or prn  Orders: -     Vortioxetine HBr; Take 0.5 tablets (5 mg total) by mouth daily.  Dispense: 30 tablet; Refill: 5  Hyperlipidemia associated with type 2 diabetes mellitus (HCC) Assessment & Plan: chronic TC 213, LDL 138 started on Crestor 5mg  every day, pt tolerating sending refill recheck lipids next visit 3 mos  Orders: -     Rosuvastatin Calcium; Take 1 tablet (5 mg total) by mouth daily. START with one pill qod for 2 weeks, then qd  Dispense: 90 tablet; Refill: 1  Chronic right shoulder pain Assessment & Plan: 2022 - pt reports seeing Dr Denyse Amass - given pred pack and PT ordered, reports prednisone helped first time, but not 2nd time. unable to lift arm even to shoulder  continue to advise on alternating heat & ice up to tid, can take 600mg  Ibuprofen tid after meals pt wants MRI to know what is exactly wrong sending low dose Mobic 7.5mg  every day & referring back to dr Denyse Amass   Orders: -     Meloxicam; Take 2 tablets (15 mg total) by mouth daily.  Dispense: 30 tablet; Refill: 0 -     Ambulatory referral to Sports Medicine  Cigarette nicotine dependence without complication Assessment & Plan: chronic hx of trying chantix - worked but caused a rash wellbutrin - increased agitation, and patches caused skin irritation advised on restarting chantix, but stay on lower dose if rash develops. OK to stop the med for a few days, then restart at lower dose. sending 0.5 mg generic Chantix, reminded pt on use & SE F/U 3 mos  Orders: -     Varenicline Tartrate; Take 1 tablet (0.5 mg total) by mouth as directed. START with 1 pill daily AFTER eating for 1 week, then increase to 2 pill daily x 1 week, then 2 pill qam, 1 pill qpm x 1week, then 2 pills qam & qpm if needed. DO not have to increase dose if having side effects.  Dispense: 90 tablet; Refill: 1   Subjective:    Outpatient  Medications Prior to Visit  Medication Sig Dispense Refill   albuterol (VENTOLIN HFA) 108 (90 Base) MCG/ACT inhaler TAKE 2 PUFFS BY MOUTH EVERY 6 HOURS AS NEEDED FOR WHEEZE OR SHORTNESS OF BREATH 18 g 3   gabapentin (NEURONTIN) 100 MG capsule Take 1-3 capsules (100-300 mg total) by mouth at bedtime as needed (nerve pain). 90 capsule 3   olmesartan-hydrochlorothiazide (BENICAR HCT) 20-12.5 MG tablet TAKE 1 TABLET BY MOUTH EVERY DAY 90 tablet 1   rosuvastatin (CRESTOR) 5 MG tablet Take 1 tablet (5 mg total) by mouth daily. START with one pill qod for 2 weeks, then qd 30 tablet 2   tirzepatide (MOUNJARO) 2.5 MG/0.5ML Pen Inject 2.5 mg into the skin once a week. 2 mL 0   vortioxetine HBr (TRINTELLIX) 5 MG TABS tablet Take 1 tablet (  5 mg total) by mouth daily. 30 tablet 0   No facility-administered medications prior to visit.   Past Medical History:  Diagnosis Date   Asthma    Chronic tension-type headache, not intractable 11/29/2017   Diabetes mellitus without complication (HCC)    Elevated BP without diagnosis of hypertension 11/29/2017   History of chickenpox    History of prediabetes    Hyperlipidemia    Hypertension    Tinea pedis of both feet 11/29/2017   Past Surgical History:  Procedure Laterality Date   CHOLECYSTECTOMY     TUBAL LIGATION     Allergies  Allergen Reactions   Omeprazole    Penicillin V    Tetracycline    Penicillins Rash      Objective:    Physical Exam Vitals and nursing note reviewed.  Constitutional:      Appearance: Normal appearance.  Cardiovascular:     Rate and Rhythm: Normal rate and regular rhythm.     Pulses:          Dorsalis pedis pulses are 2+ on the right side and 2+ on the left side.       Posterior tibial pulses are 2+ on the right side and 2+ on the left side.  Pulmonary:     Effort: Pulmonary effort is normal.     Breath sounds: Normal breath sounds.  Musculoskeletal:        General: Normal range of motion.  Feet:     Right  foot:     Protective Sensation: 10 sites tested.  10 sites sensed.     Skin integrity: Skin integrity normal.     Toenail Condition: Fungal disease present.    Left foot:     Protective Sensation: 10 sites tested.  10 sites sensed.     Skin integrity: Skin integrity normal.     Toenail Condition: Fungal disease present. Skin:    General: Skin is warm and dry.  Neurological:     Mental Status: She is alert.  Psychiatric:        Mood and Affect: Mood normal.        Behavior: Behavior normal.    BP 127/84   Pulse 82   Temp 97.8 F (36.6 C) (Temporal)   Ht 5\' 6"  (1.676 m)   Wt 168 lb 9.6 oz (76.5 kg)   SpO2 98%   BMI 27.21 kg/m  Wt Readings from Last 3 Encounters:  02/27/23 168 lb 9.6 oz (76.5 kg)  12/01/22 164 lb (74.4 kg)  08/01/22 158 lb 4 oz (71.8 kg)     Dulce Sellar, NP

## 2023-03-05 IMAGING — MG MM DIGITAL SCREENING BILAT W/ TOMO AND CAD
8 series · 8 of 24 positions shown · non-contrast
Comparison: Previous exam(s).

CLINICAL DATA: Screening.

EXAM:
DIGITAL SCREENING BILATERAL MAMMOGRAM WITH TOMOSYNTHESIS AND CAD
TECHNIQUE: Bilateral screening digital craniocaudal and mediolateral oblique
mammograms were obtained. Bilateral screening digital breast
tomosynthesis was performed. The images were evaluated with
computer-aided detection.

[R CC synth-2D]
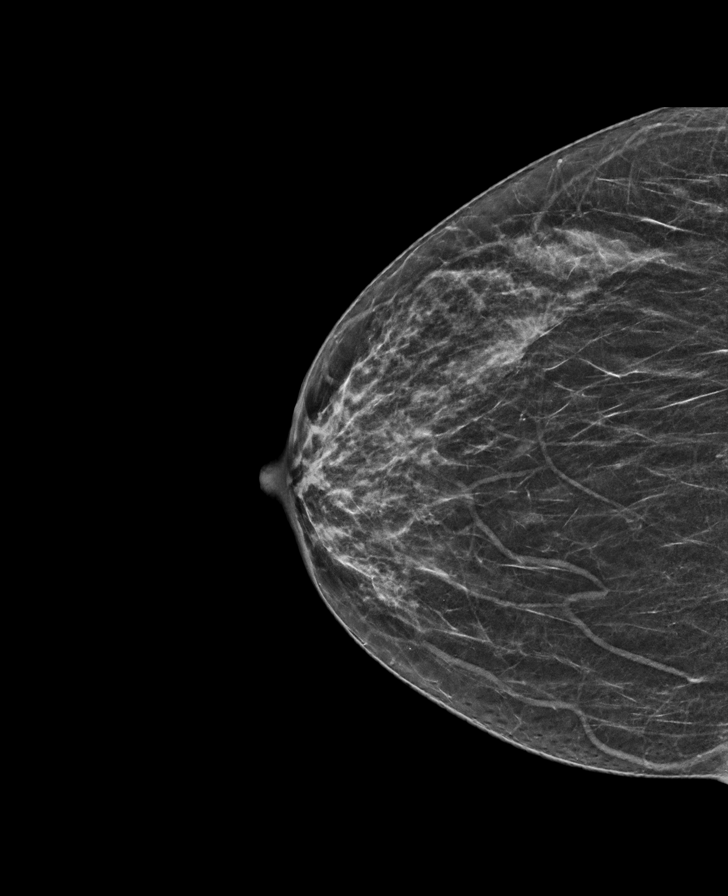

[L MLO synth-2D]
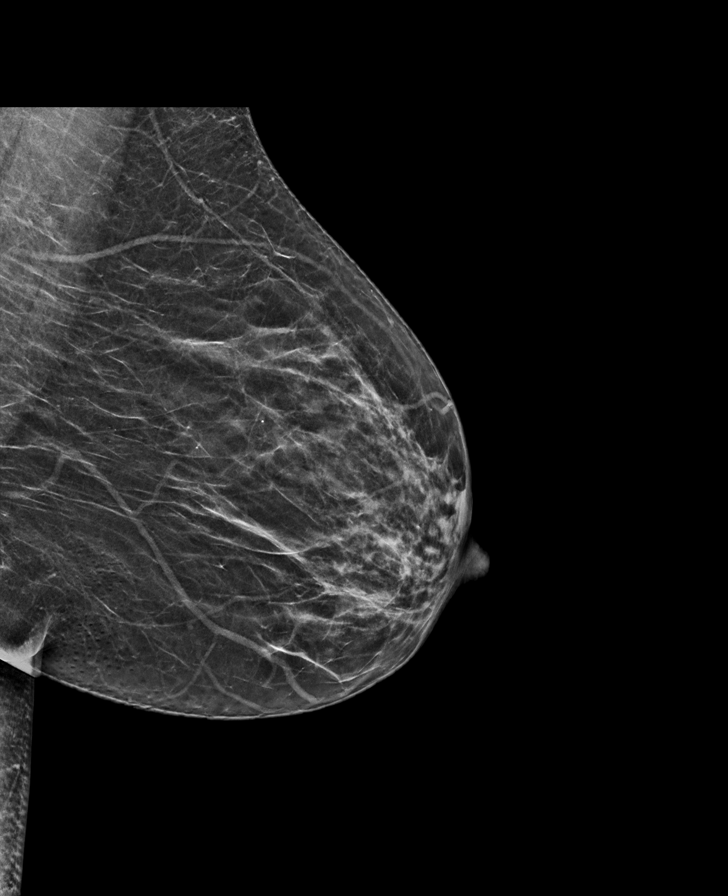

[R MLO synth-2D]
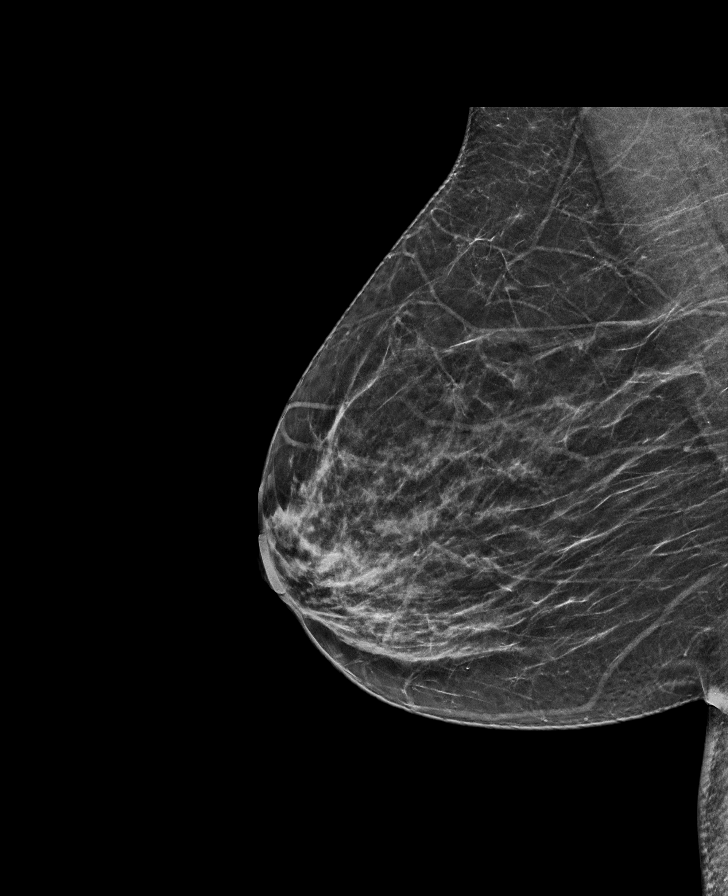

[L CC synth-2D]
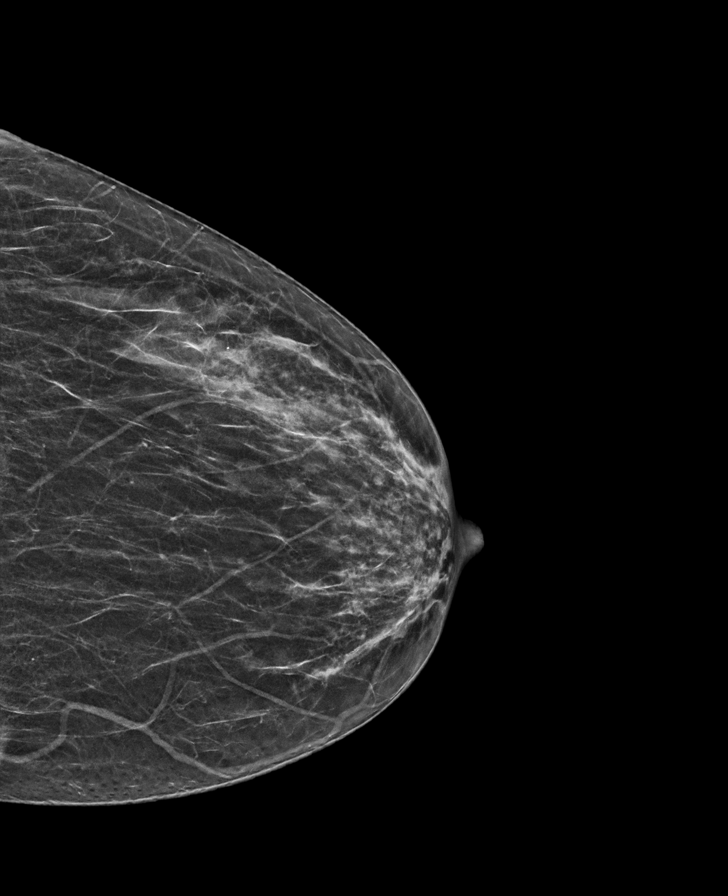

[L MLO tomo · tomo slice 29/58.0]
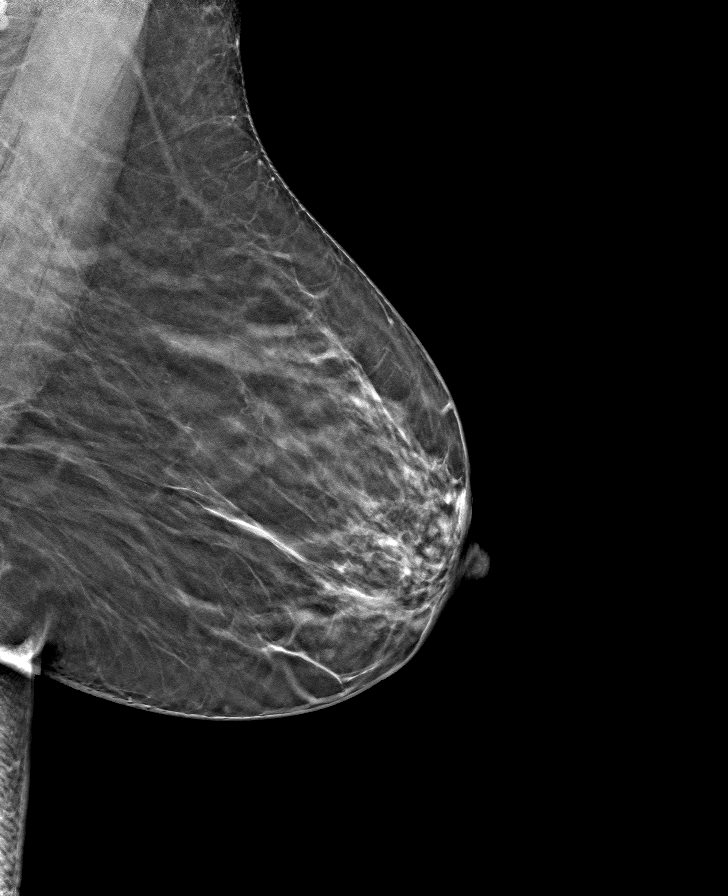

[R CC tomo · tomo slice 27/52.0]
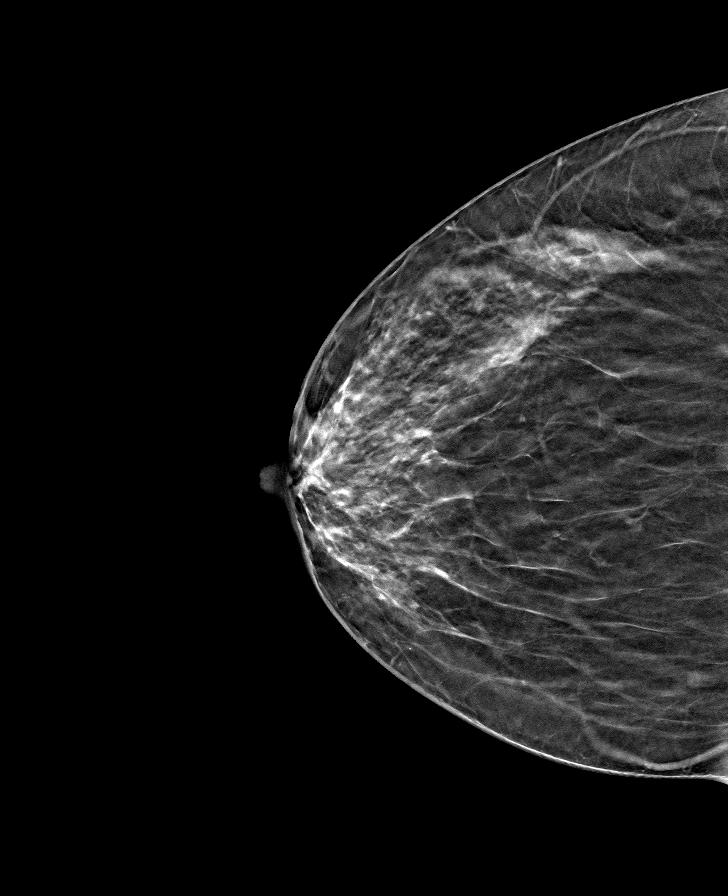

[L CC tomo · tomo slice 25/50.0]
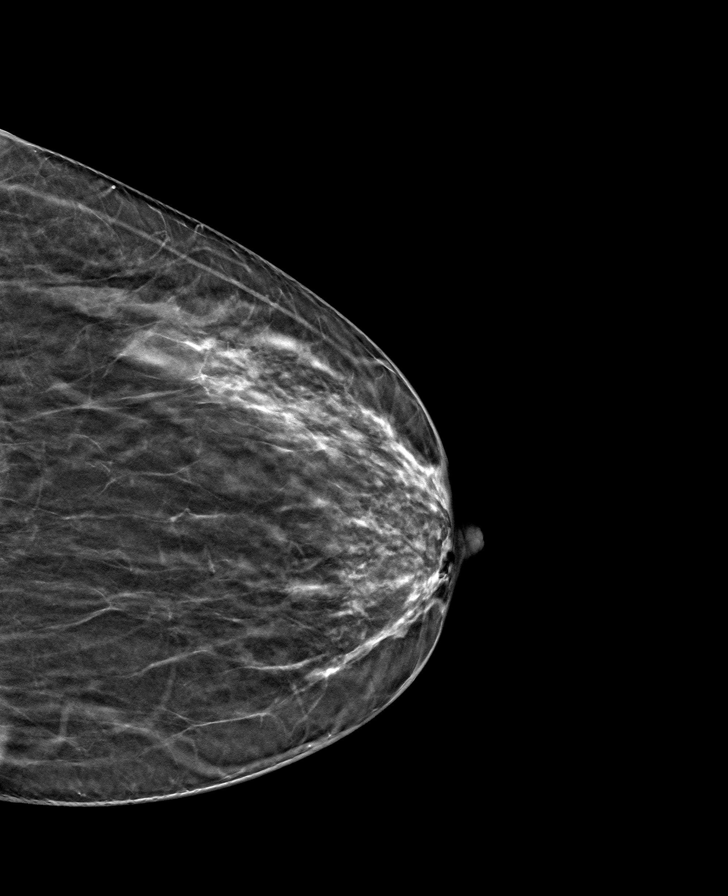

[R MLO tomo · tomo slice 29/57.0]
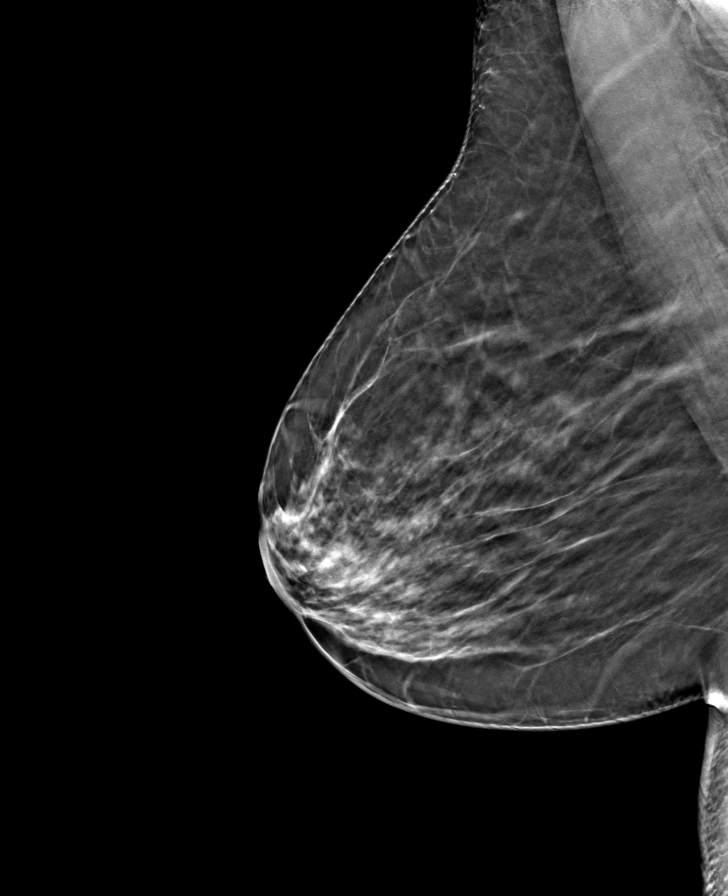

[8 of 24 positions shown; findings below may reference images not displayed]

ACR Breast Density Category c: The breast tissue is heterogeneously
dense, which may obscure small masses.
FINDINGS: There are no findings suspicious for malignancy.
IMPRESSION: No mammographic evidence of malignancy. A result letter of this
screening mammogram will be mailed directly to the patient.

RECOMMENDATION:
Screening mammogram in one year. (Code:Q3-W-BC3)

BI-RADS CATEGORY  1: Negative.

## 2023-03-08 ENCOUNTER — Ambulatory Visit (INDEPENDENT_AMBULATORY_CARE_PROVIDER_SITE_OTHER): Payer: Managed Care, Other (non HMO) | Admitting: Sports Medicine

## 2023-03-08 VITALS — BP 110/60 | Ht 62.5 in | Wt 165.0 lb

## 2023-03-08 DIAGNOSIS — M25511 Pain in right shoulder: Secondary | ICD-10-CM

## 2023-03-08 MED ORDER — DICLOFENAC SODIUM 75 MG PO TBEC: DELAYED_RELEASE_TABLET | ORAL | 0 refills | Status: DC

## 2023-03-08 NOTE — Progress Notes (Unsigned)
  Laura Santana - 57 y.o. female MRN [id number]  Date of birth: 06-Feb-1966    CHIEF COMPLAINT:   R shoulder pain    SUBJECTIVE:   HPI:  Pleasant 57 year old female past medical history of diabetes comes to clinic as a new patient to be evaluated for right shoulder pain.  She was previously evaluated by Dr. Denyse Amass at the end of last year for this.  She was diagnosed with adhesive capsulitis.  She had ultrasound-guided glenohumeral joint injection and did multiple rounds of physical therapy and prednisone for this.  She only had short-term relief and it never really got any better.  She has tried taking Mobic and that has not given her any relief.  She is frustrated by her lack of progress.  She still has pain over the lateral aspect of the shoulder and restricted range of motion.  She used to have intermittent numbness and tingling down the hand but has not had that recently.  ROS:     See HPI  PERTINENT  PMH / PSH FH / / SH:  Past Medical, Surgical, Social, and Family History Reviewed & Updated in the EMR.  Pertinent findings include:  Diabetes  OBJECTIVE: BP 110/60   Ht 5' 2.5" (1.588 m)   Wt 165 lb (74.8 kg)   BMI 29.70 kg/m   Physical Exam:  Vital signs are reviewed.  GEN: Alert and oriented, NAD Pulm: Breathing unlabored PSY: normal mood, congruent affect  MSK: R Shoulder - no obvious deformity the shoulder.  No overlying skin changes.  Nontender palpation at the [facility] joint or the biceps tendon.  Active range of motion abduction to 90 degrees, forward flexion to 110 degrees, external rotation to 10 degrees, internal rotation to back pocket.  4/5 strength with resisted external rotation, 5/5 strength with resisted internal rotation.  Positive Hawkins test.  Positive empty can test.  Negative speeds test.  Neurovascular intact distally.  ASSESSMENT & PLAN:  1.  Right shoulder adhesive capsulitis  -This is a chronic persistent problem has not gotten better with over 7  months of conservative measures including physical therapy, anti-inflammatories, and ultrasound-guided cortisone injection.  The patient would benefit from an MRI to rule out rotator cuff pathology.  Will have her get an MRI and follow-up afterwards to discuss next steps.  If there is anything that would need to be treated surgically she would be open to this and I would get her an appointment with Dr. Everardo Pacific.  In the meantime I will switch her anti-inflammatory from Mobic to diclofenac twice daily, and have her work on home exercises focusing on the sleeper stretch and wall climbs.  All questions answered she agrees to plan   Arvella Nigh, MD PGY-4, Sports Medicine Fellow [facility]  Addendum:  I was the preceptor for this visit and available for immediate consultation.  Norton Blizzard MD Marrianne Mood

## 2023-03-20 ENCOUNTER — Other Ambulatory Visit: Payer: Managed Care, Other (non HMO)

## 2023-04-21 ENCOUNTER — Other Ambulatory Visit: Payer: Self-pay | Admitting: Family

## 2023-04-21 DIAGNOSIS — F17210 Nicotine dependence, cigarettes, uncomplicated: Secondary | ICD-10-CM

## 2023-04-22 ENCOUNTER — Other Ambulatory Visit: Payer: Self-pay | Admitting: Family

## 2023-04-22 DIAGNOSIS — E119 Type 2 diabetes mellitus without complications: Secondary | ICD-10-CM

## 2023-04-25 ENCOUNTER — Ambulatory Visit: Payer: Managed Care, Other (non HMO) | Admitting: Family

## 2023-04-29 ENCOUNTER — Ambulatory Visit
Admission: RE | Admit: 2023-04-29 | Discharge: 2023-04-29 | Disposition: A | Discharge status: Home / Self Care | Payer: Managed Care, Other (non HMO) | Source: Ambulatory Visit | Attending: Family Medicine | Admitting: Family Medicine

## 2023-04-29 DIAGNOSIS — M25511 Pain in right shoulder: Secondary | ICD-10-CM

## 2023-05-05 ENCOUNTER — Encounter: Payer: Self-pay | Admitting: *Deleted

## 2023-06-14 ENCOUNTER — Other Ambulatory Visit: Payer: Self-pay | Admitting: Family

## 2023-06-14 DIAGNOSIS — E119 Type 2 diabetes mellitus without complications: Secondary | ICD-10-CM

## 2023-07-05 ENCOUNTER — Ambulatory Visit: Payer: Managed Care, Other (non HMO) | Admitting: Family

## 2023-07-05 VITALS — BP 132/74 | HR 67 | Temp 97.2°F | Ht 62.5 in | Wt 164.2 lb

## 2023-07-05 DIAGNOSIS — F419 Anxiety disorder, unspecified: Secondary | ICD-10-CM

## 2023-07-05 DIAGNOSIS — I10 Essential (primary) hypertension: Secondary | ICD-10-CM

## 2023-07-05 DIAGNOSIS — E119 Type 2 diabetes mellitus without complications: Secondary | ICD-10-CM

## 2023-07-05 DIAGNOSIS — E1169 Type 2 diabetes mellitus with other specified complication: Secondary | ICD-10-CM | POA: Diagnosis not present

## 2023-07-05 DIAGNOSIS — F17210 Nicotine dependence, cigarettes, uncomplicated: Secondary | ICD-10-CM | POA: Diagnosis not present

## 2023-07-05 DIAGNOSIS — E785 Hyperlipidemia, unspecified: Secondary | ICD-10-CM

## 2023-07-05 DIAGNOSIS — Z7985 Long-term (current) use of injectable non-insulin antidiabetic drugs: Secondary | ICD-10-CM | POA: Diagnosis not present

## 2023-07-05 DIAGNOSIS — Z114 Encounter for screening for human immunodeficiency virus [HIV]: Secondary | ICD-10-CM

## 2023-07-05 DIAGNOSIS — Z1159 Encounter for screening for other viral diseases: Secondary | ICD-10-CM

## 2023-07-05 DIAGNOSIS — Z9889 Other specified postprocedural states: Secondary | ICD-10-CM | POA: Diagnosis not present

## 2023-07-05 DIAGNOSIS — F32A Depression, unspecified: Secondary | ICD-10-CM

## 2023-07-05 LAB — MICROALBUMIN / CREATININE URINE RATIO
Creatinine,U: 138.7 mg/dL
Microalb Creat Ratio: 1 mg/g (ref 0.0–30.0)
Microalb, Ur: 1.4 mg/dL (ref 0.0–1.9)

## 2023-07-05 LAB — LIPID PANEL
Cholesterol: 216 mg/dL — ABNORMAL HIGH (ref 0–200)
HDL: 52.3 mg/dL
LDL Cholesterol: 150 mg/dL — ABNORMAL HIGH (ref 0–99)
NonHDL: 163.69
Total CHOL/HDL Ratio: 4
Triglycerides: 68 mg/dL (ref 0.0–149.0)
VLDL: 13.6 mg/dL (ref 0.0–40.0)

## 2023-07-05 MED ORDER — ROSUVASTATIN CALCIUM 5 MG PO TABS: 5.0000 mg | ORAL_TABLET | Freq: Every day | ORAL | 1 refills | Status: DC

## 2023-07-05 MED ORDER — VORTIOXETINE HBR 10 MG PO TABS: 5.0000 mg | ORAL_TABLET | Freq: Every day | ORAL | 1 refills | Status: DC

## 2023-07-05 MED ORDER — TIRZEPATIDE 7.5 MG/0.5ML ~~LOC~~ SOAJ: 7.5000 mg | SUBCUTANEOUS | 1 refills | Status: DC

## 2023-07-05 MED ORDER — OLMESARTAN MEDOXOMIL-HCTZ 20-12.5 MG PO TABS: 1.0000 | ORAL_TABLET | Freq: Every day | ORAL | 1 refills | Status: DC

## 2023-07-05 NOTE — Assessment & Plan Note (Signed)
chronic, stable taking Trintellix 10mg  sending refill today f/u in 6 months or prn

## 2023-07-05 NOTE — Assessment & Plan Note (Signed)
chronic, stable taking Olmesartan-HCTZ 20-12.5mg  qam BP good, checks sometimes at work sending refill f/u in 6 months

## 2023-07-05 NOTE — Assessment & Plan Note (Signed)
chronic TC 213, LDL 138 started on Crestor 5mg  every other day, pt tolerating sending refill recheck lipids today f/u 75yr

## 2023-07-05 NOTE — Assessment & Plan Note (Signed)
chronic, stable last A1C 6.5 11/2022 Mounjaro 5mg  no change in weight sending higher dose today, 7.5mg , continue to advise on wt loss strategies F/U in 2-3 mos

## 2023-07-05 NOTE — Progress Notes (Signed)
Patient ID: Laura Santana, female    DOB: 05/19/1966, 57 y.o.   MRN: [id number]  Chief Complaint  Patient presents with   Hypertension   Diabetes   *Discussed the use of AI scribe software for clinical note transcription with the patient, who gave verbal consent to proceed.  History of Present Illness   The patient, with a history of T2DM, HTN, Anxiety/depression,  and high cholesterol, presents for a follow-up. She reports having recent shoulder surgery in Sept. She reports limited range of motion and stiffness in the shoulder, which has not improved with physical therapy. The patient has been wearing a brace and only removes it for showering. She reports pain, which was managed with oxycodone and Tylenol, and she was given Diclofenac sodium to take, but did not take due to concerns about interactions with her regular medications. The patient also reports being on Mounjaro T2DM but has not noticed any weight loss. She has been taking this medication along with her regular medications, including Trintellix for depression and Crestor for high cholesterol. The patient also mentions a history of smoking but has been trying to quit with the help of Chantix.  Nicotine addiction:  Does want to discuss smoking cessation again. Has been on chantix, wellbutrin, and patches before.Wanted to try patches again but notes local skin reaction after removal. Did have success with smoking cessation in the past with these, though. Could not tolerate wellbutrin as it agitated her, chantix caused AE as well, skin rash, but she said it worked well to help her stop smoking, not sure when she developed a rash. RX sent last visit, but pt has not started yet. Anxiety & depression:  pt took Wellbutrin in past for smoking, but had SE w/heartburn & Paxil caused her to feel numb. Taking care of oldest granddtr. - then son had MVA in August & in the hosp for a month, now home on feeding tube, requires care, pt also  trying to work full time, husband helps some, but also working. Taking trintellix 10mg  every day, no SE.   Hypertension: Patient is currently maintained on the following medications for blood pressure: Olmesartan-HCTZ. Patient reports poor compliance with blood pressure medications - none since first of year. Patient denies chest pain, shortness of breath or swelling, but pt had  frequent headaches in past, but not currently. Last 3 blood pressure readings in our office are as follows: BP Readings from Last 3 Encounters:  07/05/23 132/74  03/08/23 110/60  02/27/23 127/84    Assessment & Plan:     Post-Operative Shoulder Pain - Limited range of motion and stiffness post-surgery. No longer on narcotic pain med. Concerns about medication interactions, specifically with diclofenac and Trintellix. -Continue physical therapy as directed. -Advised Diclofenac med is ok for short term, one week at a time.   Anxiety and depression -  -Continue Trintellix as prescribed. -Sending refill. -F/U 6 mos.  Smoking Cessation - Patient has Chantix but has not quit smoking. -Encourage use of Chantix and cessation of smoking, especially while on Mounjaro since controlling her weight gain which can occur when no longer smoking. -F/U prn.  Hyperlipidemia - Patient on Crestor every other day.  -Continue Crestor every other day. -Check cholesterol levels today. -F/U in 54yr  Type 2 diabetes -  Last A1C 6.6. On Mounjaro 5mg , very little weight loss. - Sending 7.5mg  Mounjaro dose. -Continue monitoring, no need for A1c check today. -Check microalbumin in urine today. -Resend referral for foot doctor as  requested. -Follow-up in 2-3 months to assess response to increased Mounjaro dose.  General Health Maintenance -One-time screening for HIV and Hepatitis C.   Essential hypertension Assessment & Plan: chronic, stable taking Olmesartan-HCTZ 20-12.5mg  qam BP good, checks sometimes at work sending  refill f/u in 6 months  Orders: -     Olmesartan Medoxomil-HCTZ; Take 1 tablet by mouth daily.  Dispense: 90 tablet; Refill: 1   Subjective:    Outpatient Medications Prior to Visit  Medication Sig Dispense Refill   albuterol (VENTOLIN HFA) 108 (90 Base) MCG/ACT inhaler TAKE 2 PUFFS BY MOUTH EVERY 6 HOURS AS NEEDED FOR WHEEZE OR SHORTNESS OF BREATH 18 g 3   diclofenac (VOLTAREN) 75 MG EC tablet Take one tab twice daily with food 60 tablet 0   olmesartan-hydrochlorothiazide (BENICAR HCT) 20-12.5 MG tablet Take 1 tablet by mouth daily. 90 tablet 1   rosuvastatin (CRESTOR) 5 MG tablet Take 1 tablet (5 mg total) by mouth daily. START with one pill qod for 2 weeks, then qd 90 tablet 1   tirzepatide (MOUNJARO) 5 MG/0.5ML Pen INJECT 5 MG SUBCUTANEOUSLY WEEKLY 2 mL 1   varenicline (CHANTIX) 0.5 MG tablet TAKE 1 TABLET (0.5 MG TOTAL) BY MOUTH AS DIRECTED. START WITH 1 PILL DAILY AFTER EATING FOR 1 WEEK, THEN INCREASE TO 2 PILL DAILY X 1 WEEK, THEN 2 PILL IN THE MORNING , 1 PILL IN THE EVENING X 1WEEK, THEN 2 PILLS IN THE MORNING & IN THE EVENING IF NEEDED. DO NOT HAVE TO INCREASE DOSE IF HAVING SIDE EFFECTS. 270 tablet 1   vortioxetine HBr (TRINTELLIX) 10 MG TABS tablet Take 0.5 tablets (5 mg total) by mouth daily. 30 tablet 5   gabapentin (NEURONTIN) 100 MG capsule Take 1-3 capsules (100-300 mg total) by mouth at bedtime as needed (nerve pain). 90 capsule 3   No facility-administered medications prior to visit.   Past Medical History:  Diagnosis Date   Asthma    Chronic tension-type headache, not intractable 11/29/2017   Diabetes mellitus without complication (HCC)    Elevated BP without diagnosis of hypertension 11/29/2017   History of chickenpox    History of prediabetes    Hyperlipidemia    Hypertension    Tinea pedis of both feet 11/29/2017   Past Surgical History:  Procedure Laterality Date   CHOLECYSTECTOMY     TUBAL LIGATION     Allergies  Allergen Reactions   Omeprazole     Penicillin V    Tetracycline    Penicillins Rash      Objective:    Physical Exam Vitals and nursing note reviewed.  Constitutional:      Appearance: Normal appearance.  Cardiovascular:     Rate and Rhythm: Normal rate and regular rhythm.  Pulmonary:     Effort: Pulmonary effort is normal.     Breath sounds: Normal breath sounds.  Musculoskeletal:     Right shoulder: Decreased range of motion (pt wearing a sling).  Skin:    General: Skin is warm and dry.  Neurological:     Mental Status: She is alert.  Psychiatric:        Mood and Affect: Mood normal.        Behavior: Behavior normal.    BP 132/74   Pulse 67   Temp (!) 97.2 F (36.2 C)   Ht 5' 2.5" (1.588 m)   Wt 164 lb 3.2 oz (74.5 kg)   SpO2 99%   BMI 29.55 kg/m  Wt Readings from Last  3 Encounters:  07/05/23 164 lb 3.2 oz (74.5 kg)  03/08/23 165 lb (74.8 kg)  02/27/23 168 lb 9.6 oz (76.5 kg)      Dulce Sellar, NP

## 2023-07-05 NOTE — Assessment & Plan Note (Signed)
chronic hx of trying chantix - worked but caused a rash wellbutrin - increased agitation, and patches caused skin irritation sent 0.5 mg generic Chantix last visit, but pt never started, also advised by Ortho that smoking will increase her risk of further joint damage, pt more motivated to quit after recent surgery reminded pt on how to take & SE F/U 3 mos or prn

## 2023-07-06 LAB — HEPATITIS C ANTIBODY: Hepatitis C Ab: NONREACTIVE

## 2023-07-06 LAB — HIV ANTIBODY (ROUTINE TESTING W REFLEX): HIV 1&2 Ab, 4th Generation: NONREACTIVE

## 2023-11-15 ENCOUNTER — Other Ambulatory Visit (HOSPITAL_COMMUNITY): Payer: Self-pay

## 2023-11-15 ENCOUNTER — Telehealth: Payer: Self-pay | Admitting: Pharmacy Technician

## 2023-11-15 NOTE — Telephone Encounter (Signed)
 Pharmacy Patient Advocate Encounter   Received notification from CoverMyMeds that prior authorization for [facility] 2.5MG /0.5ML PEN is required/requested.   Insurance verification completed.   The patient is insured through [employer] .   Per test claim: PA required; PA started via CoverMyMeds. KEY [id number] . Waiting for clinical questions to populate.

## 2023-11-16 NOTE — Telephone Encounter (Signed)
 Pharmacy Patient Advocate Encounter   Received notification from CoverMyMeds that prior authorization for [facility] 2.5MG /0.5ML PEN is required/requested.   Insurance verification completed.   The patient is insured through [employer] .   Per test claim: PA required; PA submitted to above mentioned insurance via CoverMyMeds Key/confirmation #/EOC [id number] Status is pending

## 2023-11-21 ENCOUNTER — Other Ambulatory Visit (HOSPITAL_COMMUNITY): Payer: Self-pay

## 2023-11-21 NOTE — Telephone Encounter (Signed)
 Pharmacy Patient Advocate Encounter  Received notification from [employer] that Prior Authorization for [facility] 2.5MG /0.5ML PEN has been APPROVED from 11/16/2023 to 11/20/2024. Ran test claim, Copay is $25.00. This test claim was processed through [facility]- copay amounts may vary at other pharmacies due to pharmacy/plan contracts, or as the patient moves through the different stages of their insurance plan.   PA #/Case ID/Reference #: [id number]

## 2024-02-24 ENCOUNTER — Other Ambulatory Visit: Payer: Self-pay | Admitting: Family

## 2024-02-24 DIAGNOSIS — I10 Essential (primary) hypertension: Secondary | ICD-10-CM

## 2024-05-29 ENCOUNTER — Other Ambulatory Visit (HOSPITAL_COMMUNITY)
Admission: RE | Admit: 2024-05-29 | Discharge: 2024-05-29 | Disposition: A | Discharge status: Home / Self Care | Source: Ambulatory Visit | Attending: Family | Admitting: Family

## 2024-05-29 ENCOUNTER — Ambulatory Visit (INDEPENDENT_AMBULATORY_CARE_PROVIDER_SITE_OTHER): Admitting: Family

## 2024-05-29 ENCOUNTER — Encounter: Payer: Self-pay | Admitting: Family

## 2024-05-29 VITALS — BP 132/84 | HR 92 | Temp 98.0°F | Ht 62.5 in | Wt 174.0 lb

## 2024-05-29 DIAGNOSIS — E785 Hyperlipidemia, unspecified: Secondary | ICD-10-CM | POA: Diagnosis not present

## 2024-05-29 DIAGNOSIS — E119 Type 2 diabetes mellitus without complications: Secondary | ICD-10-CM

## 2024-05-29 DIAGNOSIS — Z0000 Encounter for general adult medical examination without abnormal findings: Secondary | ICD-10-CM | POA: Diagnosis not present

## 2024-05-29 DIAGNOSIS — Z124 Encounter for screening for malignant neoplasm of cervix: Secondary | ICD-10-CM | POA: Diagnosis not present

## 2024-05-29 DIAGNOSIS — Z1211 Encounter for screening for malignant neoplasm of colon: Secondary | ICD-10-CM

## 2024-05-29 DIAGNOSIS — E1169 Type 2 diabetes mellitus with other specified complication: Secondary | ICD-10-CM | POA: Diagnosis not present

## 2024-05-29 DIAGNOSIS — N898 Other specified noninflammatory disorders of vagina: Secondary | ICD-10-CM | POA: Diagnosis present

## 2024-05-29 DIAGNOSIS — K769 Liver disease, unspecified: Secondary | ICD-10-CM | POA: Insufficient documentation

## 2024-05-29 LAB — LIPID PANEL
Cholesterol: 190 mg/dL (ref 0–200)
HDL: 47.3 mg/dL (ref >39.00)
LDL Cholesterol: 126 mg/dL — ABNORMAL HIGH (ref 0–99)
NonHDL: 142.88
Total CHOL/HDL Ratio: 4
Triglycerides: 82 mg/dL (ref 0.0–149.0)
VLDL: 16.4 mg/dL (ref 0.0–40.0)

## 2024-05-29 LAB — COMPREHENSIVE METABOLIC PANEL WITH GFR
ALT: 16 U/L (ref 0–35)
AST: 19 U/L (ref 0–37)
Albumin: 4.2 g/dL (ref 3.5–5.2)
Alkaline Phosphatase: 74 U/L (ref 39–117)
BUN: 8 mg/dL (ref 6–23)
CO2: 27 meq/L (ref 19–32)
Calcium: 9.6 mg/dL (ref 8.4–10.5)
Chloride: 101 meq/L (ref 96–112)
Creatinine, Ser: 0.58 mg/dL (ref 0.40–1.20)
GFR: 101.52 mL/min (ref >60.00)
Glucose, Bld: 89 mg/dL (ref 70–99)
Potassium: 4.2 meq/L (ref 3.5–5.1)
Sodium: 137 meq/L (ref 135–145)
Total Bilirubin: 0.5 mg/dL (ref 0.2–1.2)
Total Protein: 7.3 g/dL (ref 6.0–8.3)

## 2024-05-29 LAB — CBC WITH DIFFERENTIAL/PLATELET
Basophils Absolute: 0 K/uL (ref 0.0–0.1)
Basophils Relative: 0.5 % (ref 0.0–3.0)
Eosinophils Absolute: 0.5 K/uL (ref 0.0–0.7)
Eosinophils Relative: 5.4 % — ABNORMAL HIGH (ref 0.0–5.0)
HCT: 40.8 % (ref 36.0–46.0)
Hemoglobin: 13.3 g/dL (ref 12.0–15.0)
Lymphocytes Relative: 25.2 % (ref 12.0–46.0)
Lymphs Abs: 2.2 K/uL (ref 0.7–4.0)
MCHC: 32.7 g/dL (ref 30.0–36.0)
MCV: 91.8 fl (ref 78.0–100.0)
Monocytes Absolute: 0.6 K/uL (ref 0.1–1.0)
Monocytes Relative: 7.1 % (ref 3.0–12.0)
Neutro Abs: 5.3 K/uL (ref 1.4–7.7)
Neutrophils Relative %: 61.8 % (ref 43.0–77.0)
Platelets: 283 K/uL (ref 150.0–400.0)
RBC: 4.44 Mil/uL (ref 3.87–5.11)
RDW: 14.2 % (ref 11.5–15.5)
WBC: 8.6 K/uL (ref 4.0–10.5)

## 2024-05-29 LAB — HEMOGLOBIN A1C: Hgb A1c MFr Bld: 6.7 % — ABNORMAL HIGH (ref 4.6–6.5)

## 2024-05-29 LAB — TSH: TSH: 1.2 u[IU]/mL (ref 0.35–5.50)

## 2024-05-29 NOTE — Progress Notes (Signed)
 Phone [phone]  Subjective:   Patient is a 58 y.o. female presenting for annual physical.    Chief Complaint  Patient presents with   Annual Exam    Non fasting w/ labs  Discussed the use of AI scribe software for clinical note transcription with the patient, who gave verbal consent to proceed.  History of Present Illness   Laura Santana is a 58 year old female who presents for a routine physical exam and follow-up on her smoking cessation efforts.  Tobacco use and smoking cessation - Currently attempting to quit smoking - Previously used Chantix , which was effective for cessation but caused a rash and itching on hands and arms, leading to discontinuation - Plans to try a reduced dose of Chantix  to manage side effects - No use of vaping, THC, or alcohol  Psychosocial stressors and functional status - Feels overwhelmed with responsibilities, including part-time work and caring for grandchildren - Works two 12-hour shifts weekly, reduced from six to four days, with seven days off - Husband also works 12-hour shifts - Not engaging in formal exercise but remains physically active through caregiving for grandchildren  Preventive health maintenance - Completed Cologuard test three years ago - Interested in colonoscopy for comprehensive colorectal cancer screening - Due for mammogram, she will call to schedule - Last Pap smear was over five years ago and is due for repeat screening     See problem oriented charting- ROS- full  review of systems was completed and negative except for what is noted in HPI above.  The following were reviewed and entered/updated in epic: Past Medical History:  Diagnosis Date   Asthma    Chronic right shoulder pain 07/05/2022   Chronic tension-type headache, not intractable 11/29/2017   Diabetes mellitus without complication (HCC)    Elevated BP without diagnosis of hypertension 11/29/2017   Hepatic disorder 05/29/2024   order labs  for Monday - see [facility]. Avoid all alcohol/tylenol     History of chickenpox    History of prediabetes    Hyperlipidemia    Hypertension    Tinea pedis of both feet 11/29/2017   Patient Active Problem List   Diagnosis Date Noted   Status post right rotator cuff repair 07/05/2023   Hyperlipidemia associated with type 2 diabetes mellitus (HCC) 02/27/2023   Anxiety and depression 07/05/2022   Essential hypertension 07/05/2022   Cigarette nicotine  dependence without complication 08/22/2018   Diabetes mellitus (HCC) 11/29/2017   Past Surgical History:  Procedure Laterality Date   CHOLECYSTECTOMY     TUBAL LIGATION      Family History  Problem Relation Age of Onset   Asthma Mother    Stroke Mother    Kidney disease Father    Alcohol abuse Father    Hypertension Sister    Cervical cancer Sister    Thyroid  disease Sister    Hyperlipidemia Sister    Diabetes Sister    Hypertension Sister    Heart attack Sister    Diabetes Brother    Colon cancer Neg Hx    Esophageal cancer Neg Hx    Rectal cancer Neg Hx    Stomach cancer Neg Hx    Breast cancer Neg Hx     Medications- reviewed and updated Current Outpatient Medications  Medication Sig Dispense Refill   olmesartan -hydrochlorothiazide  (BENICAR  HCT) 20-12.5 MG tablet TAKE 1 TABLET BY MOUTH EVERY DAY 90 tablet 1   rosuvastatin  (CRESTOR ) 5 MG tablet Take 1 tablet (5 mg total) by mouth  daily. 90 tablet 1   varenicline  (CHANTIX ) 0.5 MG tablet TAKE 1 TABLET (0.5 MG TOTAL) BY MOUTH AS DIRECTED. START WITH 1 PILL DAILY AFTER EATING FOR 1 WEEK, THEN INCREASE TO 2 PILL DAILY X 1 WEEK, THEN 2 PILL IN THE MORNING , 1 PILL IN THE EVENING X 1WEEK, THEN 2 PILLS IN THE MORNING & IN THE EVENING IF NEEDED. DO NOT HAVE TO INCREASE DOSE IF HAVING SIDE EFFECTS. 270 tablet 1   vortioxetine  HBr (TRINTELLIX ) 10 MG TABS tablet Take 0.5 tablets (5 mg total) by mouth daily. 90 tablet 1   albuterol  (VENTOLIN  HFA) 108 (90 Base) MCG/ACT inhaler TAKE 2 PUFFS  BY MOUTH EVERY 6 HOURS AS NEEDED FOR WHEEZE OR SHORTNESS OF BREATH 18 g 3   diclofenac  (VOLTAREN ) 75 MG EC tablet Take one tab twice daily with food 60 tablet 0   tirzepatide  (MOUNJARO ) 5 MG/0.5ML Pen INJECT 5 MG SUBCUTANEOUSLY WEEKLY 2 mL 1   tirzepatide  (MOUNJARO ) 7.5 MG/0.5ML Pen Inject 7.5 mg into the skin once a week. 6 mL 1   No current facility-administered medications for this visit.   Allergies-reviewed and updated Allergies  Allergen Reactions   Omeprazole    Penicillin V    Tetracycline    Penicillins Rash   Social History   Social History Narrative   Not on file   Objective:  BP 132/84 (BP Location: Left Arm, Patient Position: Sitting, Cuff Size: Large)   Pulse 92   Temp 98 F (36.7 C) (Temporal)   Ht 5' 2.5 (1.588 m)   Wt 174 lb (78.9 kg)   SpO2 100%   BMI 31.32 kg/m  Physical Exam Vitals and nursing note reviewed.  Constitutional:      Appearance: Normal appearance. She is obese.  HENT:     Head: Normocephalic.     Right Ear: Tympanic membrane normal.     Left Ear: Tympanic membrane normal.     Nose: Nose normal.     Mouth/Throat:     Mouth: Mucous membranes are moist.  Eyes:     Pupils: Pupils are equal, round, and reactive to light.  Cardiovascular:     Rate and Rhythm: Normal rate and regular rhythm.  Pulmonary:     Effort: Pulmonary effort is normal.     Breath sounds: Normal breath sounds.  Musculoskeletal:        General: Normal range of motion.     Cervical back: Normal range of motion.  Lymphadenopathy:     Cervical: No cervical adenopathy.  Skin:    General: Skin is warm and dry.  Neurological:     Mental Status: She is alert.  Psychiatric:        Mood and Affect: Mood normal.        Behavior: Behavior normal.     Assessment and Plan   Health Maintenance counseling: 1. Anticipatory guidance: Patient counseled regarding regular dental exams q6 months, eye exams,  avoiding smoking and second hand smoke, limiting alcohol to 1  beverage per day, no illicit drugs.   2. Risk factor reduction:  Advised patient of need for regular exercise and diet rich with fruits and vegetables to reduce risk of heart attack and stroke.  Wt Readings from Last 3 Encounters:  05/29/24 174 lb (78.9 kg)  07/05/23 164 lb 3.2 oz (74.5 kg)  03/08/23 165 lb (74.8 kg)   3. Immunizations/screenings/ancillary studies Immunization History  Administered Date(s) Administered   Hepatitis B, ADULT 09/14/2005   Influenza Whole 08/23/2002  Influenza,inj,Quad PF,6+ Mos 06/14/2017, 06/12/2018, 06/18/2019, 06/28/2020   PFIZER(Purple Top)SARS-COV-2 Vaccination 06/07/2020, 06/28/2020   PPD Test 09/14/2005   Pneumococcal Polysaccharide-23 10/08/2018   Health Maintenance Due  Topic Date Due   Diabetic kidney evaluation - Urine ACR  Never done   Cervical Cancer Screening (HPV/Pap Cotest)  08/12/2022   FOOT EXAM  02/27/2024    4. Cervical cancer screening- doing today 5. Breast cancer screening-  mammogram due, pt to call Breast center to schedule 6. Colon cancer screening - ordering today 7. Skin cancer screening- advised regular sunscreen use. Denies worrisome, changing, or new skin lesions.  8. Birth control/STD check- none/N/A - she is still menstruating monthly 9. Osteoporosis screening- N/A 10. Alcohol screening: none 11. Smoking associated screening (lung cancer screening, AAA screen 65-75, UA)-  non- smoker 12. Exercise- not doing now    Adult Wellness Visit Routine wellness visit focused on preventive care. - Perform Pap smear today. - Order labs for cholesterol, A1c, metabolic panel, and blood count. - Refer for colonoscopy. - Schedule mammogram. - Encourage regular exercise for many benefits including weight & stress management, Alzheimer's, dementia, and cancer prevention.  Type 2 diabetes mellitus Management focused on regular monitoring and lab tests to assess control. - Order labs for A1c and metabolic panel. - Perform urine  test for diabetes.  Tobacco use disorder Previous successful use of Chantix , currently experiencing rash and itching. - Adjust Chantix  dosage down to manage rash and itching, but still get cessation benefit. - Reassess response to adjusted Chantix  dosage at next visit.      Recommended follow up:  Return in about 2 weeks (around 06/12/2024) for diabetes, HTN, med refills. Future Appointments  Date Time Provider Department Center  06/05/2024 10:30 AM Lucius Krabbe, NP [facility] Laura Santana    Lab/Order associations:  not fasting   Lucius Krabbe, NP

## 2024-05-29 NOTE — Patient Instructions (Signed)
 It was very nice to see you today!   I will review your lab results via MyChart in a few days.  You look great! Start exercising! Shoot for of cardio as many days as able :-)  See you soon for your follow up appointment!     PLEASE NOTE:  If you had any lab tests please let us  know if you have not heard back within a few days. You may see your results on MyChart before we have a chance to review them but we will give you a call once they are reviewed by us . If we ordered any referrals today, please let us  know if you have not heard from their office within the next week.

## 2024-05-30 ENCOUNTER — Other Ambulatory Visit: Payer: Self-pay

## 2024-05-30 ENCOUNTER — Telehealth: Payer: Self-pay

## 2024-05-30 DIAGNOSIS — N898 Other specified noninflammatory disorders of vagina: Secondary | ICD-10-CM

## 2024-05-30 LAB — CYTOLOGY - PAP
Adequacy: ABSENT
Comment: NEGATIVE
Diagnosis: NEGATIVE
High risk HPV: NEGATIVE

## 2024-05-30 LAB — MICROALBUMIN / CREATININE URINE RATIO
Creatinine,U: 152.2 mg/dL
Microalb Creat Ratio: 5.2 mg/g (ref 0.0–30.0)
Microalb, Ur: 0.8 mg/dL (ref 0.0–1.9)

## 2024-05-30 NOTE — Addendum Note (Signed)
 Addended by: Elowen Debruyn on: 05/30/2024 02:33 PM   Modules accepted: Orders

## 2024-05-30 NOTE — Telephone Encounter (Signed)
 Order has been changed.   Copied from CRM [id number]. Topic: General - Other >> May 30, 2024  2:44 PM Lauren C wrote: Reason for CRM: Jeanine calling from [facility] cytology department. She says she is unable to complete the swab because the order is still in future status. Status will need to be changed to complete order.

## 2024-05-30 NOTE — Telephone Encounter (Signed)
 I returned call and Order has placed.   Copied from CRM [id number]. Topic: General - Other >> May 30, 2024 12:15 PM Martinique E wrote: Reason for CRM: Jeanine with [facility] called in stating that they received a swab for the patient but there was not a requisition or order attached. Callback number for [facility] is [phone].

## 2024-05-31 LAB — CERVICOVAGINAL ANCILLARY ONLY
Bacterial Vaginitis (gardnerella): NEGATIVE
Candida Glabrata: NEGATIVE
Candida Vaginitis: NEGATIVE
Comment: NEGATIVE
Comment: NEGATIVE
Comment: NEGATIVE

## 2024-06-02 ENCOUNTER — Ambulatory Visit: Payer: Self-pay | Admitting: Family

## 2024-06-02 DIAGNOSIS — E119 Type 2 diabetes mellitus without complications: Secondary | ICD-10-CM

## 2024-06-02 DIAGNOSIS — E1169 Type 2 diabetes mellitus with other specified complication: Secondary | ICD-10-CM

## 2024-06-02 MED ORDER — ROSUVASTATIN CALCIUM 5 MG PO TABS
5.0000 mg | ORAL_TABLET | Freq: Every day | ORAL | 1 refills | Status: AC
Start: 2024-06-02 — End: ?

## 2024-06-02 MED ORDER — TIRZEPATIDE 7.5 MG/0.5ML ~~LOC~~ SOAJ: 7.5000 mg | SUBCUTANEOUS | 1 refills | Status: DC

## 2024-06-05 ENCOUNTER — Ambulatory Visit: Admitting: Family

## 2024-06-05 ENCOUNTER — Encounter: Payer: Self-pay | Admitting: Family

## 2024-06-05 VITALS — BP 125/79 | HR 90 | Temp 98.4°F | Ht 62.5 in | Wt 170.5 lb

## 2024-06-05 DIAGNOSIS — F17210 Nicotine dependence, cigarettes, uncomplicated: Secondary | ICD-10-CM

## 2024-06-05 DIAGNOSIS — F419 Anxiety disorder, unspecified: Secondary | ICD-10-CM

## 2024-06-05 DIAGNOSIS — E785 Hyperlipidemia, unspecified: Secondary | ICD-10-CM

## 2024-06-05 DIAGNOSIS — E119 Type 2 diabetes mellitus without complications: Secondary | ICD-10-CM

## 2024-06-05 DIAGNOSIS — Z7985 Long-term (current) use of injectable non-insulin antidiabetic drugs: Secondary | ICD-10-CM | POA: Diagnosis not present

## 2024-06-05 DIAGNOSIS — I10 Essential (primary) hypertension: Secondary | ICD-10-CM

## 2024-06-05 DIAGNOSIS — F32A Depression, unspecified: Secondary | ICD-10-CM

## 2024-06-05 DIAGNOSIS — E1169 Type 2 diabetes mellitus with other specified complication: Secondary | ICD-10-CM

## 2024-06-05 MED ORDER — TIRZEPATIDE 5 MG/0.5ML ~~LOC~~ SOAJ
5.0000 mg | SUBCUTANEOUS | 0 refills | Status: AC
Start: 2024-06-05 — End: ?

## 2024-06-05 MED ORDER — VORTIOXETINE HBR 10 MG PO TABS
5.0000 mg | ORAL_TABLET | Freq: Every day | ORAL | 1 refills | Status: AC
Start: 2024-06-05 — End: ?

## 2024-06-05 MED ORDER — RYBELSUS 3 MG PO TABS: 3.0000 mg | ORAL_TABLET | Freq: Every day | ORAL | 0 refills | Status: AC

## 2024-06-05 NOTE — Progress Notes (Signed)
 Patient ID: Laura Santana, female    DOB: Dec 19, 1965, 58 y.o.   MRN: [id number]  Chief Complaint  Patient presents with   Hypertension   Type 2 diabetes mellitus without complication, without long  Discussed the use of AI scribe software for clinical note transcription with the patient, who gave verbal consent to proceed.  History of Present Illness   Laura Santana is a 58 year old female with type 2 diabetes who presents for follow-up on her diabetes management.  Her A1c has increased slightly. She has discontinued Mounjaro  since December due to disliking the injection process and experiencing bruising, particularly on her legs. She is uncertain about the correct administration technique. She has not experienced nausea from the medication. She has not been checking her blood sugar at home but is attempting to manage her carbohydrate and sweet intake. She notes some weight gain and is working on reducing it and has dropped 4 lbs in last 3 weeks.  She is currently taking Trintellix  at 10 mg for her anxiety & depressionand feels unwell if she misses doses. She is also now taking Crestor  daily, and it does not cause discomfort. Her LDL cholesterol was slightly elevated. She uses a FlexCard for her medication copay but is unsure of the exact amount.  No tingling or numbness in her feet. She is compliant with Chantix  use.     Assessment and Plan    Type 2 diabetes mellitus Type 2 diabetes with increased A1c. Discontinued Mounjaro  due to injection aversion & bruising. No home glucose monitoring. Labs show A1c slightly up at 6.7 due to lapse in medication. Weight gain noted, but back down 4lbs today. Diabetic foot exam wnl. - Check Mounjaro  refill availability, start 5 mg biweekly if available since none for last 45m. - Prescribe Rybelsus , start 3mg , increase after two weeks if tolerated to 6mg .  - If insurance does not cover both meds, will drop mounjaro  for now and see if  weight can come down on Rybelsus  alone. - Encourage carbohydrate and sweet intake monitoring. - F/U in 3 mos  Hyperlipidemia Hyperlipidemia with increased LDL. Current low-dose Crestor  had been 3d/week, now daily. Goal LDL <70 due to diabetes. - Increase Crestor  to daily dosing as tolerated, refill sent previously. - Continue low saturated fat diet, increase cardio exercise as able. - F/U in 1 year  Depression Depression managed with Trintellix  10 mg. Reports feeling unwell if doses missed. - Refill Trintellix  prescription today. - F/U in 6 mos or prn  Tobacco use chronic hx of trying chantix  - worked but caused a rash on higher dose advised to stay at dose where rash does not occur, if still does, let me know sent 0.5 mg generic Chantix  last visit, not taking consistently until recently reminded pt on how to take & SE F/U prn  General Health Maintenance Blood pressure controlled. No neuropathy signs. Motivated by family. - Perform foot exam for neuropathy.     Subjective:    Outpatient Medications Prior to Visit  Medication Sig Dispense Refill   olmesartan -hydrochlorothiazide  (BENICAR  HCT) 20-12.5 MG tablet TAKE 1 TABLET BY MOUTH EVERY DAY 90 tablet 1   rosuvastatin  (CRESTOR ) 5 MG tablet Take 1 tablet (5 mg total) by mouth daily. 90 tablet 1   tirzepatide  (MOUNJARO ) 7.5 MG/0.5ML Pen Inject 7.5 mg into the skin once a week. 6 mL 1   varenicline  (CHANTIX ) 0.5 MG tablet TAKE 1 TABLET (0.5 MG TOTAL) BY MOUTH AS DIRECTED. START WITH 1 PILL  DAILY AFTER EATING FOR 1 WEEK, THEN INCREASE TO 2 PILL DAILY X 1 WEEK, THEN 2 PILL IN THE MORNING , 1 PILL IN THE EVENING X 1WEEK, THEN 2 PILLS IN THE MORNING & IN THE EVENING IF NEEDED. DO NOT HAVE TO INCREASE DOSE IF HAVING SIDE EFFECTS. 270 tablet 1   vortioxetine  HBr (TRINTELLIX ) 10 MG TABS tablet Take 0.5 tablets (5 mg total) by mouth daily. 90 tablet 1   No facility-administered medications prior to visit.   Past Medical History:   Diagnosis Date   Asthma    Chronic right shoulder pain 07/05/2022   Chronic tension-type headache, not intractable 11/29/2017   Diabetes mellitus without complication (HCC)    Elevated BP without diagnosis of hypertension 11/29/2017   Hepatic disorder 05/29/2024   order labs for Monday - see [facility]. Avoid all alcohol/tylenol     History of chickenpox    History of prediabetes    Hyperlipidemia    Hypertension    Tinea pedis of both feet 11/29/2017   Past Surgical History:  Procedure Laterality Date   CHOLECYSTECTOMY     TUBAL LIGATION     Allergies  Allergen Reactions   Omeprazole    Penicillin V    Tetracycline    Penicillins Rash      Objective:    Physical Exam Vitals and nursing note reviewed.  Constitutional:      Appearance: Normal appearance. She is obese.  Cardiovascular:     Rate and Rhythm: Normal rate and regular rhythm.     Pulses:          Dorsalis pedis pulses are 1+ on the right side and 1+ on the left side.       Posterior tibial pulses are 1+ on the right side and 1+ on the left side.  Pulmonary:     Effort: Pulmonary effort is normal.     Breath sounds: Normal breath sounds.  Musculoskeletal:        General: Normal range of motion.  Feet:     Right foot:     Protective Sensation: 10 sites tested.  10 sites sensed.     Skin integrity: Skin integrity normal.     Toenail Condition: Right toenails are normal.     Left foot:     Protective Sensation: 10 sites tested.  10 sites sensed.     Skin integrity: Skin integrity normal.     Toenail Condition: Left toenails are normal.  Skin:    General: Skin is warm and dry.  Neurological:     Mental Status: She is alert.  Psychiatric:        Mood and Affect: Mood normal.        Behavior: Behavior normal.    BP 125/79 (BP Location: Left Arm, Patient Position: Sitting, Cuff Size: Large)   Pulse 90   Temp 98.4 F (36.9 C) (Temporal)   Ht 5' 2.5 (1.588 m)   Wt 170 lb 8 oz (77.3 kg)   LMP  (LMP  Unknown)   SpO2 94%   BMI 30.69 kg/m  Wt Readings from Last 3 Encounters:  06/05/24 170 lb 8 oz (77.3 kg)  05/29/24 174 lb (78.9 kg)  07/05/23 164 lb 3.2 oz (74.5 kg)      Lucius Krabbe, NP

## 2024-06-05 NOTE — Assessment & Plan Note (Signed)
 chronic hx of trying chantix  - worked but caused a rash on higher dose advised to stay at dose where rash does not occur, if still does, let me know sent 0.5 mg generic Chantix  last visit, not taking consistently until recently reminded pt on how to take & SE F/U prn

## 2024-06-05 NOTE — Assessment & Plan Note (Signed)
chronic, stable taking Trintellix 10mg  sending refill today f/u in 6 months or prn

## 2024-06-05 NOTE — Assessment & Plan Note (Signed)
 chronic, stable taking Olmesartan -HCTZ 20-12.5mg  qam, refill sent recently BP good, checks sometimes at work f/u in 6 months

## 2024-06-05 NOTE — Assessment & Plan Note (Signed)
 Type 2 diabetes with increased A1c. Discontinued Mounjaro  last Dec. due to injection aversion & bruising. No home glucose monitoring. Labs show A1c slightly up at 6.7 due to lapse in medication. Weight gain noted, but back down 4lbs today. Diabetic foot exam wnl. - Check Mounjaro  refill availability, start 5 mg biweekly if available since none for last 91m. - Prescribe Rybelsus , start 3mg , increase after two weeks if tolerated to 6mg .  - If insurance does not cover both meds, will drop mounjaro  for now and see if weight can come down on Rybelsus  alone. - Encourage carbohydrate and sweet intake monitoring. - F/U in 3 mos

## 2024-06-05 NOTE — Assessment & Plan Note (Signed)
 chronic 05/2024 - LDL 128 - overall levels improved started on Crestor  5mg  every other day, pt tolerating, advised to increase to daily refill sent at [facility] f/u 2yr

## 2024-08-24 ENCOUNTER — Emergency Department (HOSPITAL_BASED_OUTPATIENT_CLINIC_OR_DEPARTMENT_OTHER)
Admission: EM | Admit: 2024-08-24 | Discharge: 2024-08-24 | Disposition: A | Discharge status: Home / Self Care | Source: Home / Self Care | Attending: Emergency Medicine | Admitting: Emergency Medicine

## 2024-08-24 ENCOUNTER — Emergency Department (HOSPITAL_BASED_OUTPATIENT_CLINIC_OR_DEPARTMENT_OTHER): Admitting: Radiology

## 2024-08-24 ENCOUNTER — Other Ambulatory Visit: Payer: Self-pay

## 2024-08-24 DIAGNOSIS — I10 Essential (primary) hypertension: Secondary | ICD-10-CM | POA: Insufficient documentation

## 2024-08-24 DIAGNOSIS — J45901 Unspecified asthma with (acute) exacerbation: Secondary | ICD-10-CM | POA: Insufficient documentation

## 2024-08-24 DIAGNOSIS — E119 Type 2 diabetes mellitus without complications: Secondary | ICD-10-CM | POA: Insufficient documentation

## 2024-08-24 LAB — BASIC METABOLIC PANEL WITH GFR
Anion gap: 8 (ref 5–15)
BUN: 10 mg/dL (ref 6–20)
CO2: 28 mmol/L (ref 22–32)
Calcium: 9.4 mg/dL (ref 8.9–10.3)
Chloride: 104 mmol/L (ref 98–111)
Creatinine, Ser: 0.78 mg/dL (ref 0.44–1.00)
GFR, Estimated: >60 mL/min (ref >60)
Glucose, Bld: 109 mg/dL — ABNORMAL HIGH (ref 70–99)
Potassium: 3.9 mmol/L (ref 3.5–5.1)
Sodium: 141 mmol/L (ref 135–145)

## 2024-08-24 LAB — CBC
HCT: 40.3 % (ref 36.0–46.0)
Hemoglobin: 13.3 g/dL (ref 12.0–15.0)
MCH: 29.8 pg (ref 26.0–34.0)
MCHC: 33 g/dL (ref 30.0–36.0)
MCV: 90.2 fL (ref 80.0–100.0)
Platelets: 288 K/uL (ref 150–400)
RBC: 4.47 MIL/uL (ref 3.87–5.11)
RDW: 14 % (ref 11.5–15.5)
WBC: 11 K/uL — ABNORMAL HIGH (ref 4.0–10.5)
nRBC: 0 % (ref 0.0–0.2)

## 2024-08-24 LAB — RESP PANEL BY RT-PCR (RSV, FLU A&B, COVID)  RVPGX2
Influenza A by PCR: NEGATIVE
Influenza B by PCR: NEGATIVE
Resp Syncytial Virus by PCR: NEGATIVE
SARS Coronavirus 2 by RT PCR: NEGATIVE

## 2024-08-24 MED ORDER — PREDNISONE 10 MG (21) PO TBPK: ORAL_TABLET | Freq: Every day | ORAL | 0 refills | Status: AC

## 2024-08-24 MED ORDER — ALBUTEROL SULFATE HFA 108 (90 BASE) MCG/ACT IN AERS
2.0000 | INHALATION_SPRAY | Freq: Once | RESPIRATORY_TRACT | Status: AC
  Administered 2024-08-24: 2 via RESPIRATORY_TRACT
  Filled 2024-08-24: qty 6.7

## 2024-08-24 MED ORDER — ALBUTEROL SULFATE HFA 108 (90 BASE) MCG/ACT IN AERS: 1.0000 | INHALATION_SPRAY | Freq: Four times a day (QID) | RESPIRATORY_TRACT | 0 refills | Status: AC | PRN

## 2024-08-24 NOTE — ED Triage Notes (Signed)
 Pt POV reporting cough and chest congestion past few days, hx asthma.

## 2024-08-24 NOTE — ED Provider Notes (Signed)
 Laura Santana Provider Note   CSN: [id number] Arrival date & time: 08/24/24  1954     Patient presents with: Cough and shortness of breath  Laura Santana is a 58 y.o. female who presented to the ED today with primary concern of cough and chest congestion that has persisted over the last 3 to 4 days.  Does have a history of asthma however has not been using any medications for the same as she has not had any issues with her breathing in the last 2 years.  Further noted history of diabetes type 2, hyperlipidemia, primary hypertension.  She does endorse cough with mucus production, describes mucus as thick with yellow-green tinge.   HPI     Prior to Admission medications  Medication Sig Start Date End Date Taking? Authorizing Provider  albuterol  (VENTOLIN  HFA) 108 (90 Base) MCG/ACT inhaler Inhale 1-2 puffs into the lungs every 6 (six) hours as needed for wheezing or shortness of breath. 08/24/24  Yes Myriam Dorn BROCKS, PA  predniSONE  (STERAPRED UNI-PAK 21 TAB) 10 MG (21) TBPK tablet Take by mouth daily. Take 6 tabs by mouth daily  for 2 days, then 5 tabs for 2 days, then 4 tabs for 2 days, then 3 tabs for 2 days, 2 tabs for 2 days, then 1 tab by mouth daily for 2 days 08/24/24  Yes Myriam Dorn C, PA  olmesartan -hydrochlorothiazide  (BENICAR  HCT) 20-12.5 MG tablet TAKE 1 TABLET BY MOUTH EVERY DAY 02/26/24   Lucius Krabbe, NP  rosuvastatin  (CRESTOR ) 5 MG tablet Take 1 tablet (5 mg total) by mouth daily. 06/02/24   Lucius Krabbe, NP  Semaglutide  (RYBELSUS ) 3 MG TABS Take 1 tablet (3 mg total) by mouth daily. 06/05/24   Lucius Krabbe, NP  tirzepatide  (MOUNJARO ) 5 MG/0.5ML Pen Inject 5 mg into the skin once a week. 06/05/24   Lucius Krabbe, NP  varenicline  (CHANTIX ) 0.5 MG tablet TAKE 1 TABLET (0.5 MG TOTAL) BY MOUTH AS DIRECTED. START WITH 1 PILL DAILY AFTER EATING FOR 1 WEEK, THEN INCREASE TO 2 PILL DAILY X 1 WEEK, THEN 2  PILL IN THE MORNING , 1 PILL IN THE EVENING X 1WEEK, THEN 2 PILLS IN THE MORNING & IN THE EVENING IF NEEDED. DO NOT HAVE TO INCREASE DOSE IF HAVING SIDE EFFECTS. 04/21/23   Lucius Krabbe, NP  vortioxetine  HBr (TRINTELLIX ) 10 MG TABS tablet Take 0.5 tablets (5 mg total) by mouth daily. 06/05/24   Lucius Krabbe, NP    Allergies: Omeprazole, Penicillin v, Tetracycline, and Penicillins    Review of Systems  HENT:  Positive for congestion and sinus pressure.   Respiratory:  Positive for cough.   All other systems reviewed and are negative.   Updated Vital Signs BP (!) 154/99 (BP Location: Left Arm)   Pulse 100   Temp 98.3 F (36.8 C) (Oral)   Resp 17   SpO2 97%   Physical Exam Vitals and nursing note reviewed.  Constitutional:      General: She is awake. She is not in acute distress.    Appearance: Normal appearance. She is well-developed, well-groomed and normal weight.  HENT:     Head: Normocephalic and atraumatic.     Mouth/Throat:     Mouth: Mucous membranes are moist.     Pharynx: Oropharynx is clear. Uvula midline.  Eyes:     Extraocular Movements: Extraocular movements intact.     Conjunctiva/sclera: Conjunctivae normal.     Pupils: Pupils are equal, round, and  reactive to light.  Cardiovascular:     Rate and Rhythm: Normal rate and regular rhythm.     Pulses: Normal pulses.     Heart sounds: Normal heart sounds. No murmur heard.    No friction rub. No gallop.  Pulmonary:     Effort: Pulmonary effort is normal.     Breath sounds: Examination of the right-upper field reveals wheezing. Examination of the left-upper field reveals wheezing. Examination of the right-middle field reveals wheezing. Examination of the left-middle field reveals wheezing. Examination of the right-lower field reveals wheezing. Examination of the left-lower field reveals wheezing. Wheezing present.     Comments: Mild expiratory wheeze noted throughout all lung fields. Abdominal:     General:  Abdomen is flat. Bowel sounds are normal.     Palpations: Abdomen is soft.  Musculoskeletal:        General: Normal range of motion.     Cervical back: Normal range of motion and neck supple.     Right lower leg: No edema.     Left lower leg: No edema.  Skin:    General: Skin is warm and dry.     Capillary Refill: Capillary refill takes less than 2 seconds.  Neurological:     General: No focal deficit present.     Mental Status: She is alert and oriented to person, place, and time. Mental status is at baseline.     GCS: GCS eye subscore is 4. GCS verbal subscore is 5. GCS motor subscore is 6.  Psychiatric:        Mood and Affect: Mood normal.        Behavior: Behavior is cooperative.     (all labs ordered are listed, but only abnormal results are displayed) Labs Reviewed  BASIC METABOLIC PANEL WITH GFR - Abnormal; Notable for the following components:      Result Value   Glucose, Bld 109 (*)    All other components within normal limits  CBC - Abnormal; Notable for the following components:   WBC 11.0 (*)    All other components within normal limits  RESP PANEL BY RT-PCR (RSV, FLU A&B, COVID)  [id number]    EKG: None  Radiology: DG Chest 2 View Result Date: 08/24/2024 EXAM: 2 VIEW(S) XRAY OF THE CHEST 08/24/2024 08:55:00 PM COMPARISON: 11/03/2017. CLINICAL HISTORY: SOB FINDINGS: LUNGS AND PLEURA: No focal pulmonary opacity. No pleural effusion. No pneumothorax. HEART AND MEDIASTINUM: No acute abnormality of the cardiac and mediastinal silhouettes. BONES AND SOFT TISSUES: No acute osseous abnormality. IMPRESSION: 1. No acute process. Electronically signed by: Pinkie Pebbles MD 08/24/2024 09:09 PM EST RP Workstation: [id number]     Procedures   Medications Ordered in the ED  albuterol  (VENTOLIN  HFA) 108 (90 Base) MCG/ACT inhaler 2 puff (2 puffs Inhalation Given 08/24/24 2332)                                    Medical Decision Making Amount and/or Complexity of Data  Reviewed Labs: ordered. Radiology: ordered.  Risk Prescription drug management.   Medical Decision Making:   Laura Santana is a 58 y.o. female who presented to the ED today with cough and shortness of breath detailed above.     Complete initial physical exam performed, notably the patient  was alert and oriented in no apparent distress.  There is some mild expiratory wheeze throughout.    Reviewed and confirmed nursing  documentation for past medical history, family history, social history.    Initial Assessment:   With the patient's presentation of cough and shortness of breath, most likely diagnosis is asthma exacerbation.  Further consider acute bronchitis/bronchiolitis, acute pneumonia.  With shortness of breath as well consider possible pneumothorax.  Initial Plan:  Provide albuterol  inhaler for relief of cough and shortness of breath. Screening labs including CBC and Metabolic panel to evaluate for infectious or metabolic etiology of disease.  CXR to evaluate for structural/infectious intrathoracic pathology.  Nasopharyngeal swab to evaluate respiratory panel to rule out viral etiology Objective evaluation as below reviewed   Initial Study Results:   Laboratory  All laboratory results reviewed without evidence of clinically relevant pathology.   Exceptions include: Mild leukocytosis of 11  Radiology:  All images reviewed independently. Agree with radiology report at this time.   DG Chest 2 View Result Date: 08/24/2024 EXAM: 2 VIEW(S) XRAY OF THE CHEST 08/24/2024 08:55:00 PM COMPARISON: 11/03/2017. CLINICAL HISTORY: SOB FINDINGS: LUNGS AND PLEURA: No focal pulmonary opacity. No pleural effusion. No pneumothorax. HEART AND MEDIASTINUM: No acute abnormality of the cardiac and mediastinal silhouettes. BONES AND SOFT TISSUES: No acute osseous abnormality. IMPRESSION: 1. No acute process. Electronically signed by: Pinkie Pebbles MD 08/24/2024 09:09 PM EST RP Workstation:  [id number]    Reassessment and Plan:   Chest x-ray does not demonstrate any acute pneumonia or pneumothorax.  With the wheeze throughout which suspect this is secondary to asthma exacerbation.  Will manage this with continued albuterol  inhaler on a every 6 hours schedule, as well as manage with corticosteroids, specifically prednisone .  Discussed his care plan with the patient along with careful return precautions which she verbalized understanding and agreement.  Given that there is no concerning signs or symptoms on her assessment, and on reassessment she had improved air entry to all lung fields with some subjective improvement, will discharge with outpatient follow-up as previously noted.  Encouraged to follow-up with primary care within the next 2 weeks for evaluation of her asthma, likely reinitiation of regular asthma treatment.  May need pulmonary referral for PFT.       Final diagnoses:  Exacerbation of asthma, unspecified asthma severity, unspecified whether persistent    ED Discharge Orders          Ordered    albuterol  (VENTOLIN  HFA) 108 (90 Base) MCG/ACT inhaler  Every 6 hours PRN        08/24/24 2306    predniSONE  (STERAPRED UNI-PAK 21 TAB) 10 MG (21) TBPK tablet  Daily        08/24/24 2306               Myriam Dorn BROCKS, [place] 08/24/24 2347    Jerrol Agent, MD 08/25/24 0045

## 2024-09-09 ENCOUNTER — Other Ambulatory Visit: Payer: Self-pay | Admitting: Family

## 2024-09-09 ENCOUNTER — Encounter: Payer: Self-pay | Admitting: Family

## 2024-09-09 DIAGNOSIS — K8020 Calculus of gallbladder without cholecystitis without obstruction: Secondary | ICD-10-CM | POA: Insufficient documentation

## 2024-09-09 DIAGNOSIS — I10 Essential (primary) hypertension: Secondary | ICD-10-CM

## 2024-09-10 ENCOUNTER — Ambulatory Visit: Admitting: Family

## 2024-09-10 ENCOUNTER — Encounter: Payer: Self-pay | Admitting: Family

## 2024-09-10 DIAGNOSIS — E119 Type 2 diabetes mellitus without complications: Secondary | ICD-10-CM

## 2024-09-10 DIAGNOSIS — I10 Essential (primary) hypertension: Secondary | ICD-10-CM

## 2024-09-10 NOTE — Progress Notes (Deleted)
 "  Patient ID: Laura Santana, female    DOB: [birth year], 58 y.o.   MRN: [id number]  No chief complaint on file.   Subjective:    Outpatient Medications Prior to Visit  Medication Sig Dispense Refill   albuterol  (VENTOLIN  HFA) 108 (90 Base) MCG/ACT inhaler Inhale 1-2 puffs into the lungs every 6 (six) hours as needed for wheezing or shortness of breath. 8.5 each 0   olmesartan -hydrochlorothiazide  (BENICAR  HCT) 20-12.5 MG tablet TAKE 1 TABLET BY MOUTH EVERY DAY 90 tablet 1   predniSONE  (STERAPRED UNI-PAK 21 TAB) 10 MG (21) TBPK tablet Take by mouth daily. Take 6 tabs by mouth daily  for 2 days, then 5 tabs for 2 days, then 4 tabs for 2 days, then 3 tabs for 2 days, 2 tabs for 2 days, then 1 tab by mouth daily for 2 days 42 tablet 0   rosuvastatin  (CRESTOR ) 5 MG tablet Take 1 tablet (5 mg total) by mouth daily. 90 tablet 1   Semaglutide  (RYBELSUS ) 3 MG TABS Take 1 tablet (3 mg total) by mouth daily. 30 tablet 0   tirzepatide  (MOUNJARO ) 5 MG/0.5ML Pen Inject 5 mg into the skin once a week. 6 mL 0   varenicline  (CHANTIX ) 0.5 MG tablet TAKE 1 TABLET (0.5 MG TOTAL) BY MOUTH AS DIRECTED. START WITH 1 PILL DAILY AFTER EATING FOR 1 WEEK, THEN INCREASE TO 2 PILL DAILY X 1 WEEK, THEN 2 PILL IN THE MORNING , 1 PILL IN THE EVENING X 1WEEK, THEN 2 PILLS IN THE MORNING & IN THE EVENING IF NEEDED. DO NOT HAVE TO INCREASE DOSE IF HAVING SIDE EFFECTS. 270 tablet 1   vortioxetine  HBr (TRINTELLIX ) 10 MG TABS tablet Take 0.5 tablets (5 mg total) by mouth daily. 90 tablet 1   No facility-administered medications prior to visit.   Past Medical History:  Diagnosis Date   Asthma    Cholelithiasis 09/09/2024   Referral to Surgery     Chronic right shoulder pain 07/05/2022   Chronic tension-type headache, not intractable 11/29/2017   Diabetes mellitus without complication (HCC)    Elevated BP without diagnosis of hypertension 11/29/2017   Hepatic disorder 05/29/2024   order labs for Monday - see  [facility]. Avoid all alcohol/tylenol     History of chickenpox    History of prediabetes    Hyperlipidemia    Hypertension    Tinea pedis of both feet 11/29/2017   Past Surgical History:  Procedure Laterality Date   CHOLECYSTECTOMY     TUBAL LIGATION     Allergies[1]    Objective:    Physical Exam Vitals and nursing note reviewed.  Constitutional:      Appearance: Normal appearance.  Cardiovascular:     Rate and Rhythm: Normal rate and regular rhythm.  Pulmonary:     Effort: Pulmonary effort is normal.     Breath sounds: Normal breath sounds.  Musculoskeletal:        General: Normal range of motion.  Skin:    General: Skin is warm and dry.  Neurological:     Mental Status: She is alert.  Psychiatric:        Mood and Affect: Mood normal.        Behavior: Behavior normal.    There were no vitals taken for this visit. Wt Readings from Last 3 Encounters:  06/05/24 170 lb 8 oz (77.3 kg)  05/29/24 174 lb (78.9 kg)  07/05/23 164 lb 3.2 oz (74.5 kg)  Lucius Krabbe, NP     [1]  Allergies Allergen Reactions   Omeprazole    Penicillin V    Tetracycline    Penicillins Rash   "
